# Patient Record
Sex: Female | Born: 1970
Health system: Southern US, Community
[De-identification: ages and names within clinical notes are randomized; demographics above are authoritative.]

## PROBLEM LIST (undated history)

## (undated) DIAGNOSIS — IMO0002 Reserved for concepts with insufficient information to code with codable children: Secondary | ICD-10-CM

## (undated) HISTORY — DX: Reserved for concepts with insufficient information to code with codable children: IMO0002

## (undated) HISTORY — PX: VARICOSE VEIN SURGERY: SHX832

## (undated) HISTORY — PX: TUBAL LIGATION: SHX77

---

## 1998-04-21 ENCOUNTER — Inpatient Hospital Stay (HOSPITAL_COMMUNITY): Admission: AD | Admit: 1998-04-21 | Discharge: 1998-04-23 | Payer: Self-pay | Admitting: Obstetrics and Gynecology

## 2009-07-17 ENCOUNTER — Ambulatory Visit: Payer: Self-pay | Admitting: Obstetrics and Gynecology

## 2009-08-07 ENCOUNTER — Other Ambulatory Visit: Payer: Self-pay | Admitting: Obstetrics & Gynecology

## 2009-08-07 ENCOUNTER — Ambulatory Visit: Payer: Self-pay | Admitting: Obstetrics & Gynecology

## 2009-08-07 ENCOUNTER — Other Ambulatory Visit: Admission: RE | Admit: 2009-08-07 | Discharge: 2009-08-07 | Payer: Self-pay | Admitting: Obstetrics and Gynecology

## 2009-08-21 ENCOUNTER — Ambulatory Visit: Payer: Self-pay | Admitting: Obstetrics & Gynecology

## 2010-07-01 ENCOUNTER — Emergency Department (HOSPITAL_COMMUNITY): Admission: EM | Admit: 2010-07-01 | Discharge: 2010-07-01 | Payer: Self-pay | Admitting: Emergency Medicine

## 2011-01-29 LAB — URINALYSIS, ROUTINE W REFLEX MICROSCOPIC
Glucose, UA: NEGATIVE mg/dL
Hgb urine dipstick: NEGATIVE
Ketones, ur: 15 mg/dL — AB
Nitrite: NEGATIVE
Specific Gravity, Urine: 1.029 (ref 1.005–1.030)
pH: 7 (ref 5.0–8.0)

## 2011-01-29 LAB — COMPREHENSIVE METABOLIC PANEL
Albumin: 4.4 g/dL (ref 3.5–5.2)
Calcium: 9.6 mg/dL (ref 8.4–10.5)
Chloride: 104 mEq/L (ref 96–112)
Creatinine, Ser: 0.6 mg/dL (ref 0.4–1.2)
GFR calc Af Amer: >60 mL/min (ref >60)
Sodium: 140 mEq/L (ref 135–145)
Total Bilirubin: 0.7 mg/dL (ref 0.3–1.2)

## 2011-01-29 LAB — CBC
HCT: 45.4 % (ref 36.0–46.0)
Hemoglobin: 16 g/dL — ABNORMAL HIGH (ref 12.0–15.0)
MCH: 31.4 pg (ref 26.0–34.0)
Platelets: 165 10*3/uL (ref 150–400)
RBC: 5.09 MIL/uL (ref 3.87–5.11)
WBC: 10.6 10*3/uL — ABNORMAL HIGH (ref 4.0–10.5)

## 2011-01-29 LAB — URINE CULTURE
Colony Count: NO GROWTH
Culture  Setup Time: 201108171828
Culture: NO GROWTH

## 2011-01-29 LAB — URINE MICROSCOPIC-ADD ON

## 2011-01-29 LAB — DIFFERENTIAL
Basophils Absolute: 0 10*3/uL (ref 0.0–0.1)
Eosinophils Absolute: 0 10*3/uL (ref 0.0–0.7)
Monocytes Absolute: 0.5 10*3/uL (ref 0.1–1.0)
Monocytes Relative: 5 % (ref 3–12)

## 2011-03-30 NOTE — Group Therapy Note (Signed)
Diane Holmes, Diane Holmes NO.:  [id number]   MEDICAL RECORD NO.:  [id number]           PATIENT TYPE:   LOCATION:  [facility]                     FACILITY:   PHYSICIAN:  Diane Antigua, MD     DATE OF BIRTH:  [birth year]   DATE OF SERVICE:                                  CLINIC NOTE   This is a 40 year old gravida 5, para 3-0-2-3 with last menstrual period  of June 29, 2009 who presented to clinic for evaluation for LEEP  procedure.  The patient reports no gyn care over the past 5 years until  this past June.  The patient was told she had a low-grade SIL followed  by a colposcopy evaluation, which demonstrated areas of high-grade SIL,  moderate dysplasia, and areas where CIS cannot be completely ruled out.  The patient understands the nature of these results, and is ready for  the LEEP procedure.   The patient denies any past medical history or past surgical history.   ALLERGIES TO MEDICATION:  PENICILLIN.   PHYSICAL EXAMINATION:  VITAL SIGNS:  Blood pressure 110/67, pulse of 56,  weight of 122.8 pounds, and height of 5 feet 5 inches.  ABDOMEN:  Soft, nontender.  GU:  The cervix and pelvic exam revealed a normal vaginal mucosa and  normal-appearing cervix, nonfriable.  No bleeding.   ASSESSMENT AND PLAN:  This is a 40 year old para 3-0-2-3 with  colposcopic finding significant for high-grade SIL with areas where CIS  cannot be ruled out.  The patient to be scheduled for a LEEP procedure  at her next visit.  Ashby Dawes of the procedure was described.  All  questions answered.  The patient to view the educational video prior to  be scheduled next week.           ______________________________  Diane Antigua, MD     PC/MEDQ  D:  07/17/2009  T:  07/17/2009  Job:  [id number]

## 2013-01-31 ENCOUNTER — Ambulatory Visit (INDEPENDENT_AMBULATORY_CARE_PROVIDER_SITE_OTHER): Payer: Managed Care, Other (non HMO) | Admitting: Family Medicine

## 2013-01-31 VITALS — BP 108/78 | HR 78 | Temp 99.1°F | Resp 16 | Ht 65.8 in | Wt 117.6 lb

## 2013-01-31 DIAGNOSIS — Z720 Tobacco use: Secondary | ICD-10-CM

## 2013-01-31 DIAGNOSIS — J029 Acute pharyngitis, unspecified: Secondary | ICD-10-CM

## 2013-01-31 DIAGNOSIS — J020 Streptococcal pharyngitis: Secondary | ICD-10-CM

## 2013-01-31 LAB — POCT RAPID STREP A (OFFICE): Rapid Strep A Screen: NEGATIVE

## 2013-01-31 NOTE — Patient Instructions (Addendum)

## 2013-01-31 NOTE — Progress Notes (Signed)
42 year old lady who is here with a history of having had a sore throat for the last 3 days. It started with a little bit of a raspy voice. The throat is continued to be sore. She does not have any rhinorrhea. She has had a minimal cough. He does smoke. She was breast-feeding until about 6 months ago but no longer is.  Objective: Pleasant lady in no major acute distress. Her TMs are normal. Throat mild erythema without exudate. Strep screen was taken. Poor dentition with inferior teeth in very bad shape. Neck supple without significant nodes. Chest is clear to auscultation. Heart regular without murmurs.  Assessment: Pharyngitis  Plan: Strep screen is pending  Results for orders placed in visit on 01/31/13  POCT RAPID STREP A (OFFICE)      Result Value Range   Rapid Strep A Screen Negative  Negative   Treat symptomatically.  STOP SMOKING!

## 2020-01-16 ENCOUNTER — Emergency Department (HOSPITAL_BASED_OUTPATIENT_CLINIC_OR_DEPARTMENT_OTHER): Payer: 59 | Attending: Physician Assistant

## 2020-01-16 ENCOUNTER — Emergency Department (HOSPITAL_BASED_OUTPATIENT_CLINIC_OR_DEPARTMENT_OTHER)
Admission: EM | Admit: 2020-01-16 | Discharge: 2020-01-16 | Disposition: A | Discharge status: Home / Self Care | Attending: Emergency Medicine | Admitting: Emergency Medicine

## 2020-01-16 ENCOUNTER — Other Ambulatory Visit: Payer: Self-pay

## 2020-01-16 ENCOUNTER — Emergency Department (HOSPITAL_BASED_OUTPATIENT_CLINIC_OR_DEPARTMENT_OTHER): Payer: 59

## 2020-01-16 ENCOUNTER — Encounter (HOSPITAL_BASED_OUTPATIENT_CLINIC_OR_DEPARTMENT_OTHER): Payer: Self-pay | Admitting: *Deleted

## 2020-01-16 DIAGNOSIS — F17200 Nicotine dependence, unspecified, uncomplicated: Secondary | ICD-10-CM | POA: Insufficient documentation

## 2020-01-16 DIAGNOSIS — X501XXA Overexertion from prolonged static or awkward postures, initial encounter: Secondary | ICD-10-CM | POA: Insufficient documentation

## 2020-01-16 DIAGNOSIS — Y929 Unspecified place or not applicable: Secondary | ICD-10-CM | POA: Insufficient documentation

## 2020-01-16 DIAGNOSIS — Y939 Activity, unspecified: Secondary | ICD-10-CM | POA: Diagnosis not present

## 2020-01-16 DIAGNOSIS — Y999 Unspecified external cause status: Secondary | ICD-10-CM | POA: Insufficient documentation

## 2020-01-16 DIAGNOSIS — M25561 Pain in right knee: Secondary | ICD-10-CM | POA: Insufficient documentation

## 2020-01-16 LAB — PREGNANCY, URINE: Preg Test, Ur: NEGATIVE

## 2020-01-16 MED ORDER — CYCLOBENZAPRINE HCL 10 MG PO TABS
10.0000 mg | ORAL_TABLET | Freq: Once | ORAL | Status: AC
  Administered 2020-01-16: 10 mg via ORAL
  Filled 2020-01-16: qty 1

## 2020-01-16 MED ORDER — IBUPROFEN 600 MG PO TABS: 600.0000 mg | ORAL_TABLET | Freq: Four times a day (QID) | ORAL | 0 refills | Status: DC | PRN

## 2020-01-16 MED ORDER — HYDROCODONE-ACETAMINOPHEN 5-325 MG PO TABS
1.0000 | ORAL_TABLET | Freq: Once | ORAL | Status: AC
  Administered 2020-01-16: 1 via ORAL
  Filled 2020-01-16: qty 1

## 2020-01-16 MED ORDER — HYDROCODONE-ACETAMINOPHEN 5-325 MG PO TABS: 1.0000 | ORAL_TABLET | Freq: Four times a day (QID) | ORAL | 0 refills | Status: DC | PRN

## 2020-01-16 MED ORDER — CYCLOBENZAPRINE HCL 10 MG PO TABS: 10.0000 mg | ORAL_TABLET | Freq: Two times a day (BID) | ORAL | 0 refills | Status: DC | PRN

## 2020-01-16 MED FILL — IBUPROFEN 600 MG TABLET: 600 | 8 days supply | Qty: 30 | Fill #0

## 2020-01-16 MED FILL — HYDROCODON-APAP 5-325: 5-325 | 3 days supply | Qty: 10 | Fill #0

## 2020-01-16 MED FILL — CYCLOBENZAPRINE HCL 10 MG T: 10 | 5 days supply | Qty: 10 | Fill #0

## 2020-01-16 NOTE — ED Triage Notes (Signed)
Patient was at work and squatted to pick up something and unable to get up.  Complaint of right knee pain

## 2020-01-16 NOTE — ED Notes (Signed)
UDS Completed

## 2020-01-16 NOTE — ED Provider Notes (Signed)
[facility]PART[facility] Provider Note   CSN: [id number] Arrival date & time: 01/16/20  1225     History Chief Complaint  Patient presents with  . Knee Pain    Diane Holmes is a 49 y.o. female presents for evaluation of acute onset, constant right lower extremity pain that began just prior to arrival.  She reports that she was at work where she had been squatting for approximately 20 minutes.  She states that when she began to stand she was unable to do so and developed severe pains to the right lower extremity from the hip radiating down to the thigh and knee.  She could not hear a "pop" but she states that work is very loud and that she wears ear protection.  Reports pain is worse along the medial aspect of the right knee.  She feels a little numbness and tingling around this area as well.  She denies any known injury otherwise.  No medicines prior to arrival.  No fevers  The history is provided by the patient.       Past Medical History:  Diagnosis Date  . Ulcer     There are no problems to display for this patient.   Past Surgical History:  Procedure Laterality Date  . TUBAL LIGATION    . VARICOSE VEIN SURGERY       OB History    Gravida  4   Para  4   Term      Preterm      AB      Living        SAB      TAB      Ectopic      Multiple      Live Births              Family History  Problem Relation Age of Onset  . Breast cancer Maternal Grandmother   . Stroke Maternal Grandmother   . Diabetes Paternal Uncle     Social History   Tobacco Use  . Smoking status: Current Every Day Smoker  . Smokeless tobacco: Never Used  Substance Use Topics  . Alcohol use: Yes    Comment: occasionally  . Drug use: No    Home Medications Prior to Admission medications   Medication Sig Start Date End Date Taking? Authorizing Provider  cyclobenzaprine (FLEXERIL) 10 MG tablet Take 1 tablet (10 mg total) by mouth 2 (two) times daily  as needed for muscle spasms. 01/16/20   Sharra Cayabyab A, PA-C  HYDROcodone-acetaminophen (NORCO/VICODIN) 5-325 MG tablet Take 1 tablet by mouth every 6 (six) hours as needed for severe pain. 01/16/20   Miyanna Wiersma A, PA-C  ibuprofen (ADVIL) 600 MG tablet Take 1 tablet (600 mg total) by mouth every 6 (six) hours as needed. 01/16/20   Nils Flack, Sanford Lindblad A, PA-C    Allergies    Penicillins  Review of Systems   Review of Systems  Constitutional: Negative for fever.  Musculoskeletal: Positive for arthralgias and myalgias.  Neurological: Positive for weakness and numbness.  All other systems reviewed and are negative.   Physical Exam Updated Vital Signs BP 110/68 (BP Location: Right Arm)   Pulse 78   Temp 98.3 F (36.8 C) (Oral)   Resp 20   Ht 5\' 5"  (1.651 m)   Wt 50.8 kg   LMP 01/24/2013   SpO2 99%   BMI 18.64 kg/m   Physical Exam Vitals and nursing note reviewed.  Constitutional:  General: She is in acute distress.     Appearance: She is well-developed.     Comments: Appears very anxious and uncomfortable sitting in wheelchair  HENT:     Head: Normocephalic and atraumatic.  Eyes:     General:        Right eye: No discharge.        Left eye: No discharge.     Conjunctiva/sclera: Conjunctivae normal.  Neck:     Vascular: No JVD.     Trachea: No tracheal deviation.  Cardiovascular:     Rate and Rhythm: Normal rate.  Pulmonary:     Effort: Pulmonary effort is normal.  Abdominal:     General: There is no distension.  Musculoskeletal:        General: Tenderness present.     Comments: Physical examination significantly limited due to pain.  No midline lumbar spine tenderness.  Mild tenderness to palpation along the posterior aspect of the right hip.  No crepitus.  Tenderness to palpation primarily along the quadriceps tendon and left MCL and medial meniscus.  Limited active range of motion of the right hip and knee.  Possible valgus instability of the right knee.  No quadriceps tendon  deformity.  Able to extend the extremity against gravity.  Skin:    General: Skin is warm and dry.     Findings: No erythema.  Neurological:     Mental Status: She is alert.     Comments: Sensation intact to light touch of bilateral lower extremities.  5/5 strength of left lower extremity major muscle groups and with plantarflexion and dorsiflexion of the left foot.  Psychiatric:        Behavior: Behavior normal.     ED Results / Procedures / Treatments   Labs (all labs ordered are listed, but only abnormal results are displayed) Labs Reviewed  PREGNANCY, URINE    EKG None  Radiology DG Knee Complete 4 Views Right  Result Date: 01/16/2020 CLINICAL DATA:  Pain EXAM: RIGHT KNEE - COMPLETE 4+ VIEW COMPARISON:  None. FINDINGS: There is no acute displaced fracture or dislocation. There is a small suprapatellar joint effusion. The joint spaces are relatively well preserved. IMPRESSION: 1. No acute displaced fracture or dislocation. 2. Small suprapatellar joint effusion. Electronically Signed   By: Katherine Mantle M.D.   On: 01/16/2020 15:13   DG Hip Unilat With Pelvis 2-3 Views Right  Result Date: 01/16/2020 CLINICAL DATA:  Injury EXAM: DG HIP (WITH OR WITHOUT PELVIS) 2-3V RIGHT COMPARISON:  None. FINDINGS: There is no evidence of hip fracture or dislocation. There is no evidence of arthropathy or other focal bone abnormality. IMPRESSION: Negative. Electronically Signed   By: Katherine Mantle M.D.   On: 01/16/2020 15:12    Procedures Procedures (including critical care time)  Medications Ordered in ED Medications  HYDROcodone-acetaminophen (NORCO/VICODIN) 5-325 MG per tablet 1 tablet (1 tablet Oral Given 01/16/20 1314)  cyclobenzaprine (FLEXERIL) tablet 10 mg (10 mg Oral Given 01/16/20 1314)    ED Course  I have reviewed the triage vital signs and the nursing notes.  Pertinent labs & imaging results that were available during my care of the patient were reviewed by me and  considered in my medical decision making (see chart for details).    MDM Rules/Calculators/A&P                       Patient presenting for evaluation of right lower extremity pain secondary to injury at work  where she was squatting and attempted to stand up.  She is afebrile, initially a little hypertensive but vital signs otherwise stable.  She did appear to be quite uncomfortable and her physical examination was initially limited due to her pain.  Her pain was controlled in the ED.  She is neurovascularly intact and compartments are soft.  Radiographs show a small suprapatellar joint effusion to the right knee but otherwise no acute osseous abnormality.  On reevaluation the patient is resting more comfortably, reports that she is feeling better.  She is able to extend flex and extend the knee against resistance.  Pelvis appears stable.  No midline spine tenderness.  Doubt DVT, septic arthritis, osteomyelitis.  She does have some valgus instability on examination of the left knee and has maximal tenderness to palpation along the MCL.  Suspect possible ligament injury.  Will place in knee immobilizer and give crutches.  Discussed conservative therapy and management with NSAIDs, Tylenol.  Discussed utility of hydrocodone and muscle relaxers and discussed potential side effects and appropriate use of these medications.  Recommend follow-up with orthopedics on an outpatient basis.  Discussed strict ED return precautions. Patient verbalized understanding of and agreement with plan and is safe for discharge home at this time.  Final Clinical Impression(s) / ED Diagnoses Final diagnoses:  Acute pain of right knee    Rx / DC Orders ED Discharge Orders         Ordered    HYDROcodone-acetaminophen (NORCO/VICODIN) 5-325 MG tablet  Every 6 hours PRN     01/16/20 1559    ibuprofen (ADVIL) 600 MG tablet  Every 6 hours PRN     01/16/20 1559    cyclobenzaprine (FLEXERIL) 10 MG tablet  2 times daily PRN      01/16/20 1559           Jeanie Sewer, PA-C 01/16/20 1721    Gwyneth Sprout, MD 01/19/20 0710

## 2020-01-16 NOTE — Discharge Instructions (Addendum)
I suspect he may have injured a ligament in your knee.  Your x-rays are reassuring, just some fluid around the right knee.  1. Medications: Alternate 600 mg of ibuprofen and [phone] mg of Tylenol every 3 hours as needed for pain. Do not exceed 4000 mg of Tylenol daily.  Take ibuprofen with food to avoid upset stomach issues.  You can take hydrocodone as needed for severe breakthrough pain but do not drive, drink alcohol, or operate heavy machinery while taking this medicine as it can cause drowsiness.  Be aware this medicine also contains Tylenol.  The Flexeril which is a muscle relaxer can also cause drowsiness so the same precautions apply. 2. Treatment: rest, use the knee immobilizer and crutches to help you get around, avoid bearing weight for the first couple of days.  When you are not walking, keep the extremity elevated and apply ice 20 minutes at a time. 3. Follow Up: Please followup with orthopedics as directed or your PCP in 1 week if no improvement for discussion of your diagnoses and further evaluation after today's visit; Please return to the ER for worsening symptoms or other concerns such as worsening swelling, redness of the skin, fevers, loss of pulses, or loss of feeling

## 2020-08-22 IMAGING — DX DG KNEE COMPLETE 4+V*R*
4 series · 4 of 4 positions shown · non-contrast
Comparison: None.

CLINICAL DATA: Pain

EXAM:
RIGHT KNEE - COMPLETE 4+ VIEW

[knee ap]
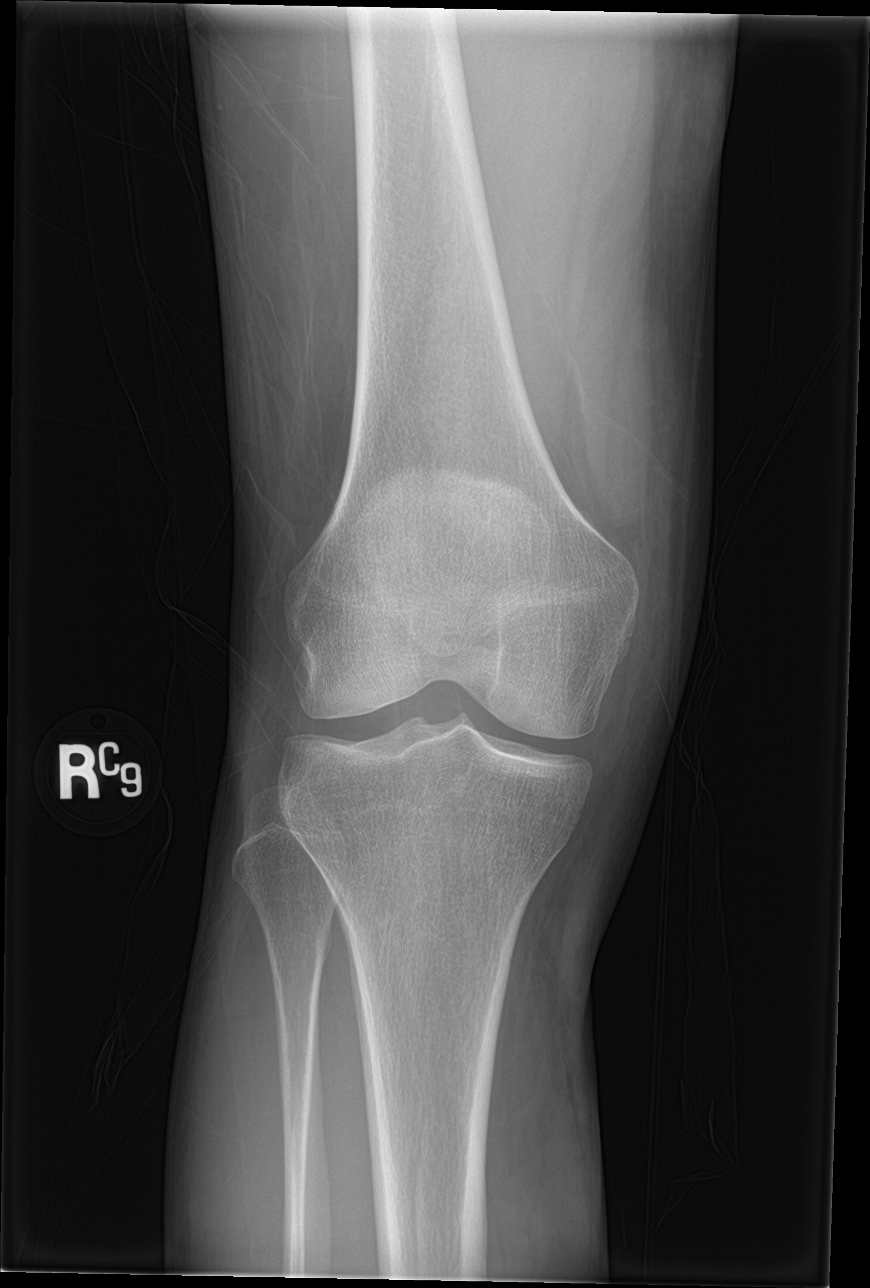

[knee obl (1 of 2)]
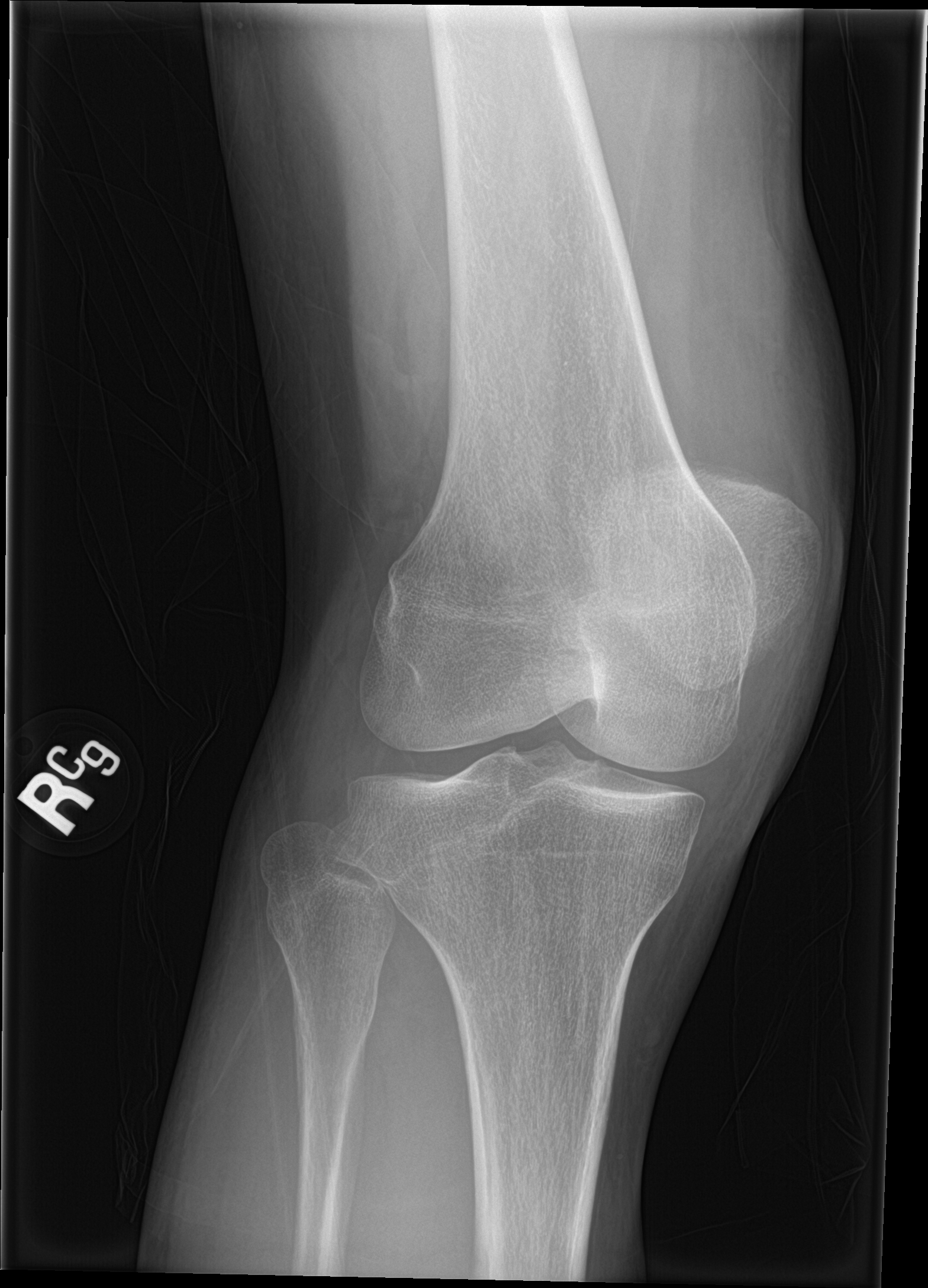

[knee obl (2 of 2)]
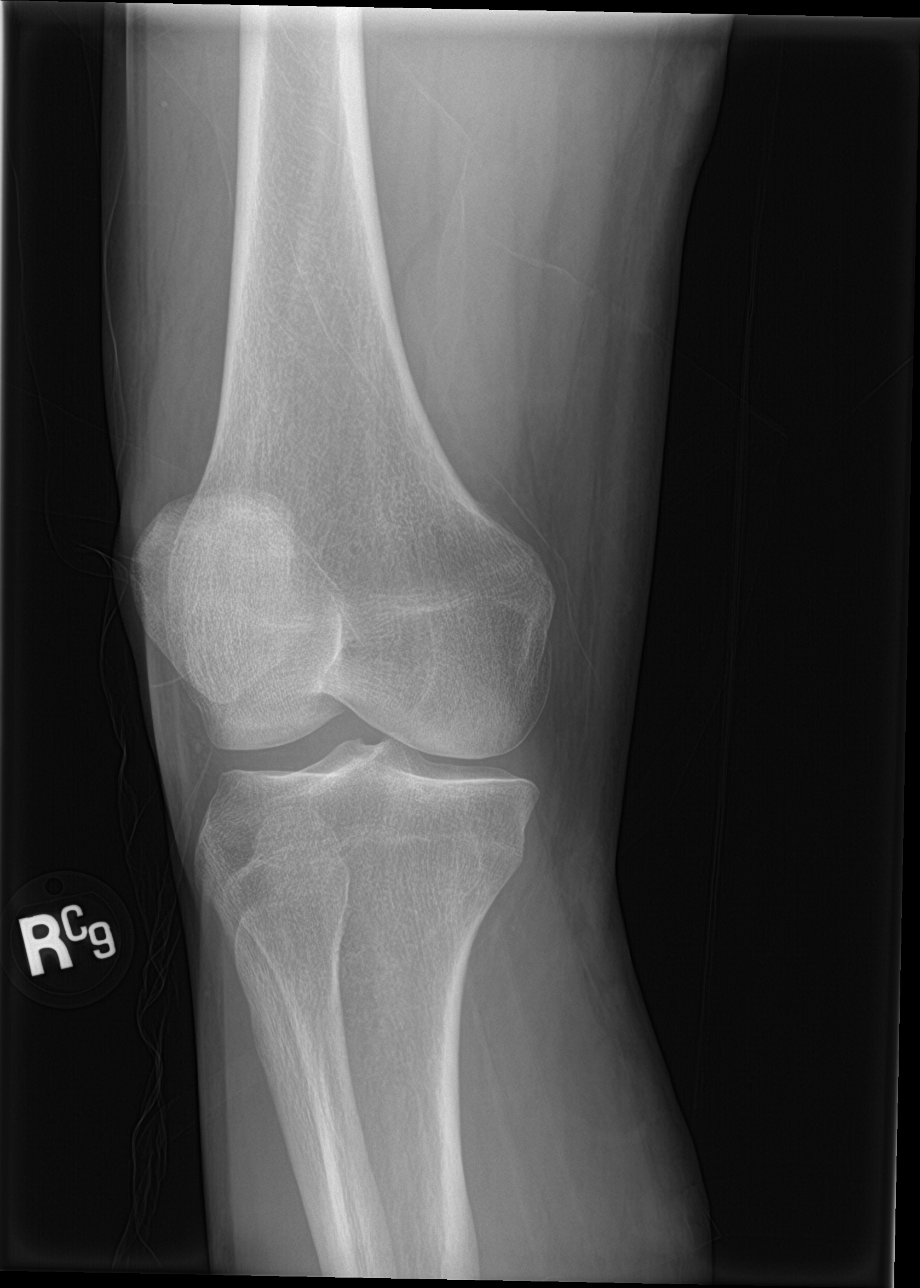

[knee lat]
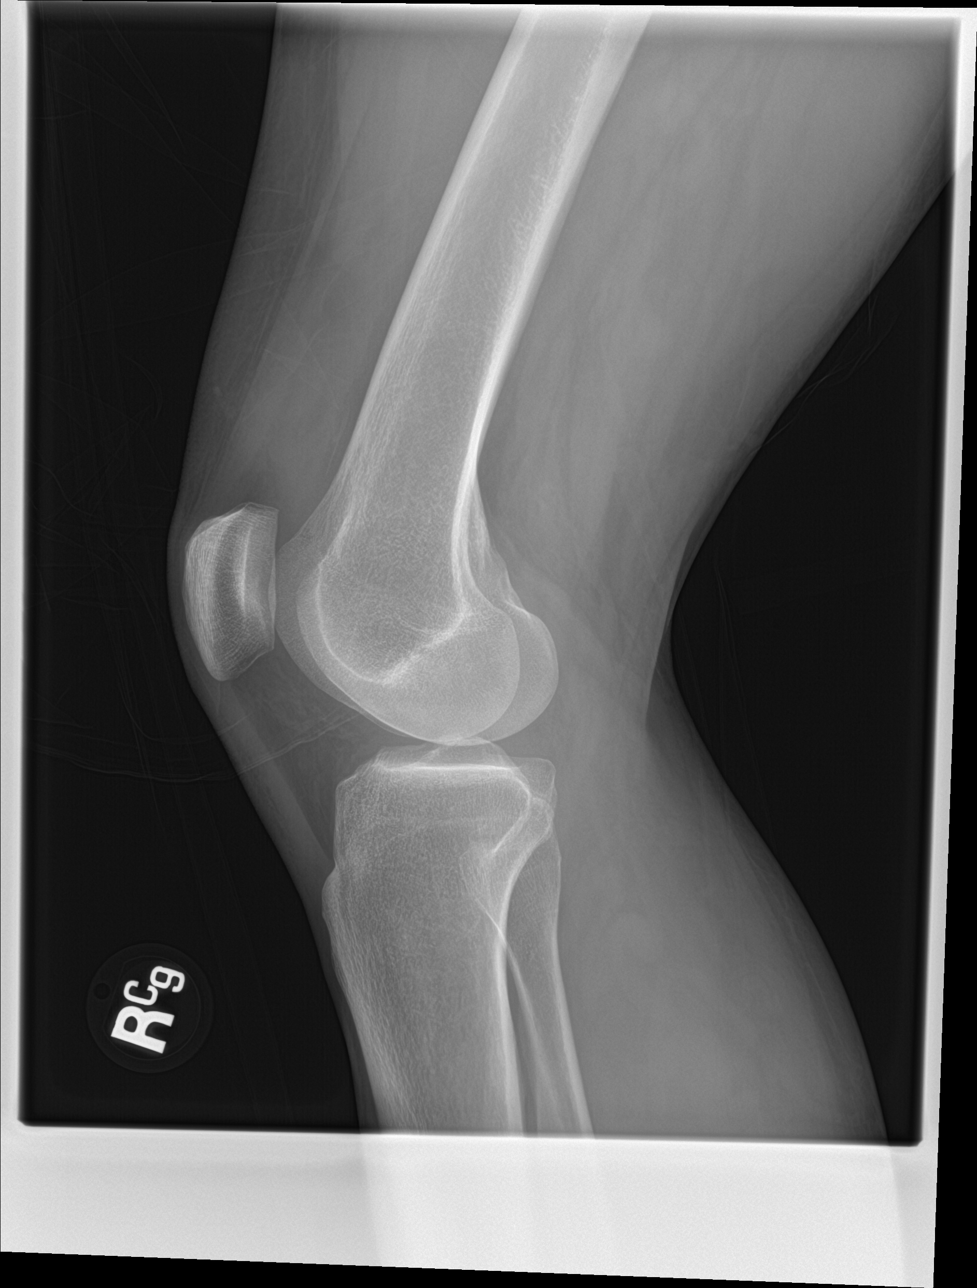

[4 of 4 positions shown; findings below may reference images not displayed]

FINDINGS: There is no acute displaced fracture or dislocation. There is a
small suprapatellar joint effusion. The joint spaces are relatively
well preserved.
IMPRESSION: 1. No acute displaced fracture or dislocation.
2. Small suprapatellar joint effusion.

## 2022-12-08 DIAGNOSIS — R0981 Nasal congestion: Secondary | ICD-10-CM | POA: Diagnosis not present

## 2022-12-08 DIAGNOSIS — J029 Acute pharyngitis, unspecified: Secondary | ICD-10-CM | POA: Diagnosis not present

## 2022-12-08 DIAGNOSIS — R197 Diarrhea, unspecified: Secondary | ICD-10-CM | POA: Diagnosis not present

## 2022-12-08 DIAGNOSIS — R509 Fever, unspecified: Secondary | ICD-10-CM | POA: Diagnosis not present

## 2022-12-08 DIAGNOSIS — J069 Acute upper respiratory infection, unspecified: Secondary | ICD-10-CM | POA: Diagnosis not present

## 2023-05-12 DIAGNOSIS — J029 Acute pharyngitis, unspecified: Secondary | ICD-10-CM | POA: Diagnosis not present

## 2023-05-12 DIAGNOSIS — R52 Pain, unspecified: Secondary | ICD-10-CM | POA: Diagnosis not present

## 2023-05-12 DIAGNOSIS — U071 COVID-19: Secondary | ICD-10-CM | POA: Diagnosis not present

## 2023-05-12 DIAGNOSIS — R059 Cough, unspecified: Secondary | ICD-10-CM | POA: Diagnosis not present

## 2023-12-18 DIAGNOSIS — J069 Acute upper respiratory infection, unspecified: Secondary | ICD-10-CM | POA: Diagnosis not present

## 2024-02-27 ENCOUNTER — Inpatient Hospital Stay (HOSPITAL_COMMUNITY)
Admission: EM | Admit: 2024-02-27 | Discharge: 2024-02-29 | DRG: 814 | Disposition: A | Discharge status: Home / Self Care | Attending: Internal Medicine | Admitting: Internal Medicine

## 2024-02-27 ENCOUNTER — Other Ambulatory Visit: Payer: Self-pay

## 2024-02-27 ENCOUNTER — Emergency Department (HOSPITAL_COMMUNITY)

## 2024-02-27 ENCOUNTER — Encounter (HOSPITAL_COMMUNITY): Payer: Self-pay | Admitting: Internal Medicine

## 2024-02-27 DIAGNOSIS — M549 Dorsalgia, unspecified: Secondary | ICD-10-CM | POA: Diagnosis not present

## 2024-02-27 DIAGNOSIS — Z833 Family history of diabetes mellitus: Secondary | ICD-10-CM | POA: Diagnosis not present

## 2024-02-27 DIAGNOSIS — F17210 Nicotine dependence, cigarettes, uncomplicated: Secondary | ICD-10-CM | POA: Diagnosis present

## 2024-02-27 DIAGNOSIS — I700 Atherosclerosis of aorta: Secondary | ICD-10-CM | POA: Diagnosis not present

## 2024-02-27 DIAGNOSIS — I7779 Dissection of other artery: Secondary | ICD-10-CM | POA: Diagnosis not present

## 2024-02-27 DIAGNOSIS — Z880 Allergy status to penicillin: Secondary | ICD-10-CM

## 2024-02-27 DIAGNOSIS — R519 Headache, unspecified: Secondary | ICD-10-CM | POA: Insufficient documentation

## 2024-02-27 DIAGNOSIS — E785 Hyperlipidemia, unspecified: Secondary | ICD-10-CM | POA: Diagnosis present

## 2024-02-27 DIAGNOSIS — Z716 Tobacco abuse counseling: Secondary | ICD-10-CM

## 2024-02-27 DIAGNOSIS — K921 Melena: Secondary | ICD-10-CM | POA: Diagnosis not present

## 2024-02-27 DIAGNOSIS — F17200 Nicotine dependence, unspecified, uncomplicated: Secondary | ICD-10-CM | POA: Diagnosis not present

## 2024-02-27 DIAGNOSIS — I2510 Atherosclerotic heart disease of native coronary artery without angina pectoris: Secondary | ICD-10-CM | POA: Diagnosis not present

## 2024-02-27 DIAGNOSIS — D735 Infarction of spleen: Secondary | ICD-10-CM | POA: Diagnosis not present

## 2024-02-27 DIAGNOSIS — F419 Anxiety disorder, unspecified: Secondary | ICD-10-CM | POA: Diagnosis not present

## 2024-02-27 DIAGNOSIS — R109 Unspecified abdominal pain: Secondary | ICD-10-CM | POA: Diagnosis not present

## 2024-02-27 DIAGNOSIS — Z823 Family history of stroke: Secondary | ICD-10-CM

## 2024-02-27 DIAGNOSIS — K551 Chronic vascular disorders of intestine: Secondary | ICD-10-CM | POA: Diagnosis present

## 2024-02-27 DIAGNOSIS — R0789 Other chest pain: Secondary | ICD-10-CM | POA: Diagnosis not present

## 2024-02-27 DIAGNOSIS — I8390 Asymptomatic varicose veins of unspecified lower extremity: Secondary | ICD-10-CM | POA: Diagnosis present

## 2024-02-27 DIAGNOSIS — Z8711 Personal history of peptic ulcer disease: Secondary | ICD-10-CM | POA: Diagnosis not present

## 2024-02-27 DIAGNOSIS — K55069 Acute infarction of intestine, part and extent unspecified: Secondary | ICD-10-CM | POA: Diagnosis not present

## 2024-02-27 DIAGNOSIS — Z803 Family history of malignant neoplasm of breast: Secondary | ICD-10-CM

## 2024-02-27 DIAGNOSIS — Z8249 Family history of ischemic heart disease and other diseases of the circulatory system: Secondary | ICD-10-CM

## 2024-02-27 DIAGNOSIS — K259 Gastric ulcer, unspecified as acute or chronic, without hemorrhage or perforation: Secondary | ICD-10-CM | POA: Diagnosis not present

## 2024-02-27 HISTORY — DX: Infarction of spleen: D73.5

## 2024-02-27 LAB — I-STAT CHEM 8, ED
BUN: 10 mg/dL (ref 6–20)
Calcium, Ion: 1.1 mmol/L — ABNORMAL LOW (ref 1.15–1.40)
Chloride: 100 mmol/L (ref 98–111)
Creatinine, Ser: 0.6 mg/dL (ref 0.44–1.00)
Glucose, Bld: 96 mg/dL (ref 70–99)
HCT: 48 % — ABNORMAL HIGH (ref 36.0–46.0)
Hemoglobin: 16.3 g/dL — ABNORMAL HIGH (ref 12.0–15.0)
Potassium: 3.7 mmol/L (ref 3.5–5.1)
Sodium: 136 mmol/L (ref 135–145)
TCO2: 27 mmol/L (ref 22–32)

## 2024-02-27 LAB — BASIC METABOLIC PANEL WITH GFR
Anion gap: 11 (ref 5–15)
BUN: 9 mg/dL (ref 6–20)
CO2: 25 mmol/L (ref 22–32)
Calcium: 9.3 mg/dL (ref 8.9–10.3)
Chloride: 101 mmol/L (ref 98–111)
Creatinine, Ser: 0.67 mg/dL (ref 0.44–1.00)
GFR, Estimated: >60 mL/min (ref >60)
Glucose, Bld: 96 mg/dL (ref 70–99)
Potassium: 3.9 mmol/L (ref 3.5–5.1)
Sodium: 137 mmol/L (ref 135–145)

## 2024-02-27 LAB — URINALYSIS, ROUTINE W REFLEX MICROSCOPIC
Bilirubin Urine: NEGATIVE
Glucose, UA: NEGATIVE mg/dL
Hgb urine dipstick: NEGATIVE
Ketones, ur: NEGATIVE mg/dL
Leukocytes,Ua: NEGATIVE
Nitrite: NEGATIVE
Protein, ur: NEGATIVE mg/dL
Specific Gravity, Urine: 1.009 (ref 1.005–1.030)
pH: 7 (ref 5.0–8.0)

## 2024-02-27 LAB — CBC
HCT: 47.1 % — ABNORMAL HIGH (ref 36.0–46.0)
Hemoglobin: 16.1 g/dL — ABNORMAL HIGH (ref 12.0–15.0)
MCH: 30.8 pg (ref 26.0–34.0)
MCHC: 34.2 g/dL (ref 30.0–36.0)
MCV: 90.1 fL (ref 80.0–100.0)
Platelets: 166 10*3/uL (ref 150–400)
RBC: 5.23 MIL/uL — ABNORMAL HIGH (ref 3.87–5.11)
RDW: 11.8 % (ref 11.5–15.5)
WBC: 8.6 10*3/uL (ref 4.0–10.5)
nRBC: 0 % (ref 0.0–0.2)

## 2024-02-27 LAB — TROPONIN I (HIGH SENSITIVITY)
Troponin I (High Sensitivity): 4 ng/L (ref <18)
Troponin I (High Sensitivity): 5 ng/L (ref <18)

## 2024-02-27 LAB — LIPASE, BLOOD: Lipase: 38 U/L (ref 11–51)

## 2024-02-27 LAB — HIV ANTIBODY (ROUTINE TESTING W REFLEX): HIV Screen 4th Generation wRfx: NONREACTIVE

## 2024-02-27 MED ORDER — HEPARIN (PORCINE) 25000 UT/250ML-% IV SOLN
1050.0000 [IU]/h | INTRAVENOUS | Status: DC
  Administered 2024-02-27: 900 [IU]/h via INTRAVENOUS
  Administered 2024-02-28: 950 [IU]/h via INTRAVENOUS
  Filled 2024-02-27 (×2): qty 250

## 2024-02-27 MED ORDER — KETOROLAC TROMETHAMINE 15 MG/ML IJ SOLN
15.0000 mg | Freq: Once | INTRAMUSCULAR | Status: AC
  Administered 2024-02-27: 15 mg via INTRAVENOUS
  Filled 2024-02-27: qty 1

## 2024-02-27 MED ORDER — HYDROMORPHONE HCL 1 MG/ML IJ SOLN
0.5000 mg | Freq: Once | INTRAMUSCULAR | Status: AC
  Administered 2024-02-27: 0.5 mg via INTRAVENOUS
  Filled 2024-02-27: qty 1

## 2024-02-27 MED ORDER — HEPARIN BOLUS VIA INFUSION
3500.0000 [IU] | Freq: Once | INTRAVENOUS | Status: AC
  Administered 2024-02-27: 3500 [IU] via INTRAVENOUS
  Filled 2024-02-27: qty 3500

## 2024-02-27 MED ORDER — OXYCODONE HCL 5 MG PO TABS
5.0000 mg | ORAL_TABLET | ORAL | Status: DC | PRN
  Administered 2024-02-28 – 2024-02-29 (×4): 5 mg via ORAL
  Filled 2024-02-27 (×4): qty 1

## 2024-02-27 MED ORDER — ACETAMINOPHEN 500 MG PO TABS
1000.0000 mg | ORAL_TABLET | Freq: Three times a day (TID) | ORAL | Status: DC
  Administered 2024-02-27 – 2024-02-29 (×3): 1000 mg via ORAL
  Filled 2024-02-27 (×5): qty 2

## 2024-02-27 MED ORDER — SODIUM CHLORIDE 0.9 % IV BOLUS
1000.0000 mL | Freq: Once | INTRAVENOUS | Status: AC
  Administered 2024-02-27: 1000 mL via INTRAVENOUS

## 2024-02-27 MED ORDER — HYDROMORPHONE HCL 1 MG/ML IJ SOLN: 0.5000 mg | INTRAMUSCULAR | Status: DC | PRN

## 2024-02-27 MED ORDER — IOHEXOL 350 MG/ML SOLN
100.0000 mL | Freq: Once | INTRAVENOUS | Status: AC | PRN
  Administered 2024-02-27: 100 mL via INTRAVENOUS

## 2024-02-27 NOTE — H&P (Cosign Needed Addendum)
 Date: 02/27/2024               Patient Name:  Diane Holmes MRN: [id number]  DOB: 06/26/1971 Age / Sex: 53 y.o., female   PCP: Patient, No Pcp Per              Medical Service: Internal Medicine Teaching Service              Attending Physician: Dr. Broadus Canes, Whitney Hams, MD    First Contact: Enos Harts, MS4 Pager: [phone]  Second Contact: Dr. Hayes Lipps Pager: [phone]            After Hours (After 5p/  First Contact Pager: [phone]  weekends / holidays): Second Contact Pager: [phone]   Chief Complaint: "Terrible pain in my stomach"  Patient Summary Diane Holmes is a 53 y.o. female with a 38 pack year smoking history and no significant past medical history who presents to the ED with abdominal pain found to have splenic artery dissection.  History of Present Illness Yesterday, Diane Holmes noticed that her stomach felt full. She says that the sensation felt like she may need too use the bathroom. Today, when she woke up around 3 AM, she felt abdominal pain that began centrally in her lower abdomen. She went to work as normal at 5 AM, trying to work through the pain. The pain spread from the lower abdomen to the periumbilical region then the epigastrum, and radiating to the back. By 8 AM in the morning, she said that the pain was so severe that her boss told her she couldn't work in this condition. This has never happened before. She denies nausea, vomiting, changes in vision, dizziness, hemiplegia, tachycardia, chest pain, or abdominal trauma. Two weeks ago, she had an upper respiratory infection, and her daughter who lives with her tested positive for flu. She has been experiencing regular headaches for 3 months that do not require pharmacologic treatment, which she attribute to caffeine use. She endorses inconsistent bowel movements that are intermittently hard and dry, or black and sticky. Her boyfriend in the room suggests that her stools have been black and sticky  for 3 months. She denies any bright red blood in stools.  Past Medical History Gastric ulcer requiring hospitalization, treated with diet  Meds None  Allergies As a child broke out in hives with penicillin and has avoided them since.  Surgical history Tubal ligation  Family History Father had a heart attack Does not know of any Marfan's syndrome/Ehlers-Danlos syndrome or habitus in family  Social History Lives at home with daughter and boyfriend. 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 She has smoked 1 ppd since she was 14. She drinks 2 shots once per month. She does not use non prescription drugs or over the counter medications, besides occasional tylenol or alleve for headaches once every 2 weeks.  Review of Systems: A complete ROS was negative except as per HPI.  Vitals: Blood pressure 124/79, pulse (!) 58, temperature 98.4 F (36.9 C), temperature source Axillary, resp. rate 15, height 5\' 4"  (1.626 m), weight 53.5 kg, last menstrual period 01/24/2013, SpO2 97%.  Physical Exam: GEN: No acute distress, uncomfortable in bed because of abdominal pain, breathing without increased effort, normally proportioned body habitus, face mildly pale CV: RRR, no murmurs, PT 2+ bilaterally, feet warm, well perfused, hands warm, capillary refill <2s, no carotid bruits PULM: CTAB, able to take deep breaths without pain ABD: Epigastric tenderness directly below  the sternum, tender in right lower quadrant with involuntary guarding, no rebound tenderness, no bruises  Labs Lipase 38 Hgb 16.3 hsTropI 5  Imaging CTA chest/abdomen/pelvis - Mid splenic artery dissection - Changes consistent with splenic infarction - Complete SMA occlusion - No aortic dissection - Chronic changes: emphysema, aortic atherosclerosis, coronary calcifications  EKG Sinus rhythm, no ischemic changes  Assessment & Plan by Problem:  Assessment Diane Holmes is a 53 y.o. female with  a 38 pack year smoking history and no significant past medical history who presents to the ED with abdominal pain found to have splenic artery dissection, splenic infarction and SMA obstruction then admitted for anticoagulation and management of vascular injuries.  Principal Problem   Dissection of splenic artery (HCC)   Occlusion of superior mesenteric artery (HCC)   Splenic infarction CT demonstrates multiple vascular injuries and sequelae of these injuries. Treatment being driven by vascular surgery, will follow recs from Dr. Fulton Job and team. Patient feels better after 1L NS bolus, heparin gtt and hydromorphone. The cause of this constellation of findings is unknown. The differential is broad but in the absence of trauma includes connective tissue disease (Marfan's / Elhers-Danlos / homocystinuria), vasculitis, blood pressure changes or infection in the context of tobacco related arterial disease. Patient's blood pressure is within normal limits. - Heparin 900 U/hr - CBC every day - Pain control -- Ketorolac 15 mg IV one time -- Tylenol 1000 mg Q8H oral -- Oxycodone 5 mg Q4H PRN oral -- Hydromorphone 0.5 mg Q4H PRN IV - Vascular surgery currently not planning for surgical intervention  Active Problems   Tobacco use disorder Patient's smoking may be contributing to their susceptibility to their disease. Broached the topic of quitting at admission. Plan to explore available options for quitting further during hospital stay. - TOC consult for tobacco cessation    Black tarry stools Intermittent black stools may represent a new gastric ulcer. Plan to follow up with outpatient. Will need to find a PCP. - Outpatient follow up - Will need to find PCP  Diet: Regular VTE: Heparin Code: Full  Dispo: Admit patient to Inpatient with expected length of stay greater than 2 midnights.  Signed: Mccartney Chuba, Medical Student 02/27/2024, 5:55 PM

## 2024-02-27 NOTE — Progress Notes (Signed)
 ANTICOAGULATION CONSULT NOTE  Pharmacy Consult for Heparin Indication:  splenic artery dissection/occlusion  Allergies  Allergen Reactions   Penicillins Hives    Patient Measurements: Height: 5\' 4"  (162.6 cm) Weight: 53.5 kg (118 lb) IBW/kg (Calculated) : 54.7 Heparin Dosing Weight: 53.5 kg  Vital Signs: Temp: 98.2 F (36.8 C) (04/14 1303) Temp Source: Oral (04/14 1303) BP: 107/48 (04/14 1252) Pulse Rate: 67 (04/14 1252)  Labs: Recent Labs    02/27/24 0902 02/27/24 0920 02/27/24 1104  HGB 16.1* 16.3*  --   HCT 47.1* 48.0*  --   PLT 166  --   --   CREATININE 0.67 0.60  --   TROPONINIHS 4  --  5    Estimated Creatinine Clearance: 69.5 mL/min (by C-G formula based on SCr of 0.6 mg/dL).   Medical History: Past Medical History:  Diagnosis Date   Ulcer     Medications:  (Not in a hospital admission)  Scheduled:    HYDROmorphone (DILAUDID) injection  0.5 mg Intravenous Once   Infusions:  PRN:   Assessment: 52 yof presenting with abdominal pain. Heparin per pharmacy consult placed for  splenic artery dissection/occlusion .  Patient is not on anticoagulation prior to arrival.  Hgb 16.3; plt 166  Goal of Therapy:  Heparin level 0.3-0.7 units/ml Monitor platelets by anticoagulation protocol: Yes   Plan:  Give IV heparin 3500 units bolus x 1 Start heparin infusion at 900 units/hr Check anti-Xa level in 8 hours and daily while on heparin Continue to monitor H&H and platelets  Dionicio Fray, PharmD, BCPS 02/27/2024 3:30 PM ED Clinical Pharmacist -  [phone]

## 2024-02-27 NOTE — Hospital Course (Addendum)
 Vascular looked at the images, repeat in day or 2, clark will see, heparin   Woke up, stomach felt full. Constipated? Abdominal pain worsened as day went on. Went from suprapubic to perumbilical to epigastric to substernal radiating to back No n/v Headaches for 3 months, thinks 2/2 caffeine 2 weeks constipation No tachycardia, weakness, dizziness, abdominal trauma Darker stools recently. No brbpr  No pmh No pcp  No meds  Tubal ligation  Tobacco: 1 ppd for 40 years Alcohol: rare, once per month Drugs: none  Works at [employer] with daughter and boyfriend

## 2024-02-27 NOTE — ED Provider Notes (Signed)
 [place] EMERGENCY DEPARTMENT AT [facility] Provider Note  CSN: [id number] Arrival date & time: 02/27/24 4696  Chief Complaint(s) Abdominal Pain, Back Pain, and Chest Pain  HPI Diane Holmes is a 53 y.o. female without significant past medical history presenting to the emergency department with abdominal pain.  Patient reports abdominal pain throughout her abdomen, reports it radiates to her back and up to her chest.  Denies similar episode.  Woke up at 3 AM to get ready for work and the pain was already there.  She reports that the pain waxes and wanes.  No fevers or chills.  No nausea or vomiting.  Does not go to arms or legs.  No headache.  No numbness or tingling, weakness.  No diarrhea.  No urinary symptoms.  Denies similar episode.  Past Medical History Past Medical History:  Diagnosis Date   Ulcer    There are no active problems to display for this patient.  Home Medication(s) Prior to Admission medications   Not on File                                                                                                                                    Past Surgical History Past Surgical History:  Procedure Laterality Date   TUBAL LIGATION     VARICOSE VEIN SURGERY     Family History Family History  Problem Relation Age of Onset   Breast cancer Maternal Grandmother    Stroke Maternal Grandmother    Diabetes Paternal Uncle     Social History Social History   Tobacco Use   Smoking status: Every Day   Smokeless tobacco: Never  Substance Use Topics   Alcohol use: Yes    Comment: occasionally   Drug use: No   Allergies Penicillins  Review of Systems Review of Systems  All other systems reviewed and are negative.   Physical Exam Vital Signs  I have reviewed the triage vital signs BP 95/73   Pulse (!) 51   Temp 98.2 F (36.8 C) (Oral)   Resp 15   Ht 5\' 4"  (1.626 m)   Wt 53.5 kg   LMP 01/24/2013   SpO2 98%   BMI 20.25 kg/m  Physical  Exam Vitals and nursing note reviewed.  Constitutional:      General: She is not in acute distress.    Appearance: She is well-developed.     Comments: Appears uncomfortable  HENT:     Head: Normocephalic and atraumatic.     Mouth/Throat:     Mouth: Mucous membranes are moist.  Eyes:     Pupils: Pupils are equal, round, and reactive to light.  Cardiovascular:     Rate and Rhythm: Normal rate and regular rhythm.     Pulses:          Radial pulses are 2+ on the right side and 2+ on the left side.  Heart sounds: No murmur heard. Pulmonary:     Effort: Pulmonary effort is normal. No respiratory distress.     Breath sounds: Normal breath sounds.  Abdominal:     General: Abdomen is flat.     Palpations: Abdomen is soft.     Tenderness: There is generalized abdominal tenderness.  Musculoskeletal:        General: No tenderness.     Right lower leg: No edema.     Left lower leg: No edema.  Skin:    General: Skin is warm and dry.  Neurological:     General: No focal deficit present.     Mental Status: She is alert. Mental status is at baseline.  Psychiatric:        Mood and Affect: Mood normal.        Behavior: Behavior normal.     ED Results and Treatments Labs (all labs ordered are listed, but only abnormal results are displayed) Labs Reviewed  CBC - Abnormal; Notable for the following components:      Result Value   RBC 5.23 (*)    Hemoglobin 16.1 (*)    HCT 47.1 (*)    All other components within normal limits  I-STAT CHEM 8, ED - Abnormal; Notable for the following components:   Calcium, Ion 1.10 (*)    Hemoglobin 16.3 (*)    HCT 48.0 (*)    All other components within normal limits  BASIC METABOLIC PANEL WITH GFR  LIPASE, BLOOD  URINALYSIS, ROUTINE W REFLEX MICROSCOPIC  HEPARIN LEVEL (UNFRACTIONATED)  TROPONIN I (HIGH SENSITIVITY)  TROPONIN I (HIGH SENSITIVITY)                                                                                                                           Radiology CT Angio Chest/Abd/Pel for Dissection W and/or W/WO Result Date: 02/27/2024 CLINICAL DATA:  Acute onset chest, back, and stomach pain EXAM: CT ANGIOGRAPHY CHEST, ABDOMEN AND PELVIS TECHNIQUE: Non-contrast CT of the chest was initially obtained. Multidetector CT imaging through the chest, abdomen and pelvis was performed using the standard protocol during bolus administration of intravenous contrast. Multiplanar reconstructed images and MIPs were obtained and reviewed to evaluate the vascular anatomy. RADIATION DOSE REDUCTION: This exam was performed according to the departmental dose-optimization program which includes automated exposure control, adjustment of the mA and/or kV according to patient size and/or use of iterative reconstruction technique. CONTRAST:  OMNIPAQUE IOHEXOL 350 MG/ML SOLN COMPARISON:  Same day chest radiograph, CT abdomen and pelvis dated 07/01/2010 FINDINGS: CTA CHEST FINDINGS Cardiovascular: Preferential opacification of the thoracic aorta. Nongated examination with motion artifact involving the aortic root and ascending aorta. No evidence of thoracic aortic aneurysm or dissection. Normal heart size. No pericardial effusion. No central pulmonary emboli. Coronary artery calcifications. Mediastinum/Nodes: Imaged thyroid gland without nodules meeting criteria for imaging follow-up by size. Normal esophagus. No pathologically enlarged axillary, supraclavicular, mediastinal, or hilar lymph nodes. Lungs/Pleura: The central airways are  patent. Upper lung predominant moderate paraseptal and centrilobular emphysema. Innumerable punctate and more tubular calcified granulomata in the right middle lobe. No pneumothorax. No pleural effusion. Musculoskeletal: No acute or abnormal lytic or blastic osseous lesions. Review of the MIP images confirms the above findings. CTA ABDOMEN AND PELVIS FINDINGS VASCULAR Dissection flap involving the proximal to mid splenic  artery (5:101) with irregularity and mild dilation of the downstream splenic artery (5:99). Left gastric artery arises directly from the aorta. Aortic atherosclerosis. Mild luminal narrowing of the proximal superior mesenteric artery (5:107) and segmental complete occlusion spanning 1.7 cm (10:102, 8:79) With distal reconstitution and poststenotic dilation measuring 7 mm (5:131). Graph Segmental mild-to-moderate narrowing of the bilateral common, external iliac, and proximal internal iliac arteries due to atherosclerotic plaque. Veins: No obvious venous abnormality within the limitations of this arterial phase study. Review of the MIP images confirms the above findings. NON-VASCULAR Hepatobiliary: Subcentimeter focus of arterial enhancement within segment 6 (5:119), too small to characterize but likely a flash filling hemangioma or perfusional variation. No intra or extrahepatic biliary ductal dilation. Normal gallbladder. Pancreas: No focal lesions or main ductal dilation. Spleen: Expected heterogeneous arterial enhancement of the spleen anteriorly. There is a relatively sharp demarcation with homogeneous hypoenhancement of the posteromedial spleen (5:92). Adrenals/Urinary Tract: No adrenal nodules. No suspicious renal mass, calculi or hydronephrosis. No focal bladder wall thickening. Stomach/Bowel: Normal appearance of the stomach. No evidence of bowel wall thickening, distention, or inflammatory changes. Appendix is not discretely seen. Lymphatic: No enlarged abdominal or pelvic lymph nodes. Reproductive: No adnexal masses. Other: No free fluid, fluid collection, or free air. Musculoskeletal: No acute or abnormal lytic or blastic osseous lesions. Review of the MIP images confirms the above findings. IMPRESSION: 1. Dissection flap involving the proximal to mid splenic artery with irregularity and mild dilation of the downstream splenic artery. 2. Sharply demarcated, homogeneous hypoenhancement of the posteromedial  spleen, suspicious for splenic infarction. 3. 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 4. No evidence of thoracic aortic aneurysm or dissection. 5. Aortic Atherosclerosis (ICD10-I70.0) and Emphysema (ICD10-J43.9). Coronary artery calcifications. Assessment for potential risk factor modification, dietary therapy or pharmacologic therapy may be warranted, if clinically indicated. These results will be called to the ordering clinician or representative by the Radiologist Assistant, and communication documented in the PACS or [employer]. Electronically Signed   By: Agustin Cree M.D.   On: 02/27/2024 14:21   DG Chest 2 View Result Date: 02/27/2024 CLINICAL DATA:  Abdominal pain and history of stomach ulcer EXAM: CHEST - 2 VIEW COMPARISON:  Chest radiograph dated 01/08/2017 FINDINGS: Normal lung volumes. Unchanged innumerable nodular densities throughout both lungs, which may reflect sequela of prior granulomatous infection. No new focal consolidations. No pleural effusion or pneumothorax. The heart size and mediastinal contours are within normal limits. No acute osseous abnormality. IMPRESSION: No active cardiopulmonary disease. Electronically Signed   By: Agustin Cree M.D.   On: 02/27/2024 10:10    Pertinent labs & imaging results that were available during my care of the patient were reviewed by me and considered in my medical decision making (see MDM for details).  Medications Ordered in ED Medications  heparin ADULT infusion 100 units/mL (25000 units/230mL) (900 Units/hr Intravenous New Bag/Given 02/27/24 1546)  HYDROmorphone (DILAUDID) injection 0.5 mg (0.5 mg Intravenous Given 02/27/24 0943)  sodium chloride 0.9 % bolus 1,000 mL (0 mLs Intravenous Stopped 02/27/24 1103)  ketorolac (TORADOL) 15 MG/ML injection 15 mg (  15 mg Intravenous Given 02/27/24 1059)  iohexol (OMNIPAQUE) 350 MG/ML injection 100 mL (100 mLs  Intravenous Contrast Given 02/27/24 1217)  HYDROmorphone (DILAUDID) injection 0.5 mg (0.5 mg Intravenous Given 02/27/24 1535)  heparin bolus via infusion 3,500 Units (3,500 Units Intravenous Bolus from Bag 02/27/24 1546)                                                                                                                                     Procedures .Critical Care  Performed by: Mordecai Applebaum, MD Authorized by: Mordecai Applebaum, MD   Critical care provider statement:    Critical care time (minutes):  30   Critical care was necessary to treat or prevent imminent or life-threatening deterioration of the following conditions:  Circulatory failure   Critical care was time spent personally by me on the following activities:  Development of treatment plan with patient or surrogate, discussions with consultants, evaluation of patient's response to treatment, examination of patient, ordering and review of laboratory studies, ordering and review of radiographic studies, ordering and performing treatments and interventions, pulse oximetry, re-evaluation of patient's condition and review of old charts   Care discussed with: admitting provider     (including critical care time)  Medical Decision Making / ED Course   MDM:  53 year old presenting to the emergency department with abdominal pain.  Patient overall well-appearing, though slightly uncomfortable.  Examination otherwise with generalized tenderness.  No focal tenderness.  Differential includes pancreatitis, gastritis, cholecystitis, perforation, obstruction, abscess, diverticulitis, volvulus, also includes dissection given radiation to the chest and back.  Will obtain imaging including CT scan, will obtain dissection protocol study to rule out dissection.  Lower concern for dissection but think needs to be ruled out.  Will treat pain and reassess.  Clinical Course as of 02/27/24 1604  Mon Feb 27, 2024  1603 CT scan shows  splenic artery dissection and splenic infarct.  Discussed with Dr. Fulton Job, vascular surgery.  He recommends medical admission, anticoagulation with heparin, he will consult, probably will just rescan the patient in a couple of days.  Discussed with internal medicine teaching service who have admitted the patient. [WS]    Clinical Course User Index [WS] Mordecai Applebaum, MD     Additional history obtained:  -External records from outside source obtained and reviewed including: Chart review including previous notes, labs, imaging, consultation notes including prior notes    Lab Tests: -I ordered, reviewed, and interpreted labs.   The pertinent results include:   Labs Reviewed  CBC - Abnormal; Notable for the following components:      Result Value   RBC 5.23 (*)    Hemoglobin 16.1 (*)    HCT 47.1 (*)    All other components within normal limits  I-STAT CHEM 8, ED - Abnormal; Notable for the following components:   Calcium, Ion 1.10 (*)    Hemoglobin 16.3 (*)  HCT 48.0 (*)    All other components within normal limits  BASIC METABOLIC PANEL WITH GFR  LIPASE, BLOOD  URINALYSIS, ROUTINE W REFLEX MICROSCOPIC  HEPARIN LEVEL (UNFRACTIONATED)  TROPONIN I (HIGH SENSITIVITY)  TROPONIN I (HIGH SENSITIVITY)    Notable for elevated RBC count  EKG   EKG Interpretation Date/Time:  Monday February 27 2024 08:58:02 EDT Ventricular Rate:  60 PR Interval:  133 QRS Duration:  86 QT Interval:  422 QTC Calculation: 422 R Axis:   85  Text Interpretation: Sinus rhythm Confirmed by Hiawatha Lout ([id number]) on 02/27/2024 9:18:12 AM         Imaging Studies ordered: I ordered imaging studies including CTA dissection study On my interpretation imaging demonstrates splenic artery dissection I independently visualized and interpreted imaging. I agree with the radiologist interpretation   Medicines ordered and prescription drug management: Meds ordered this encounter  Medications    HYDROmorphone (DILAUDID) injection 0.5 mg   sodium chloride 0.9 % bolus 1,000 mL   ketorolac (TORADOL) 15 MG/ML injection 15 mg   iohexol (OMNIPAQUE) 350 MG/ML injection 100 mL   HYDROmorphone (DILAUDID) injection 0.5 mg   heparin ADULT infusion 100 units/mL (25000 units/250mL)   heparin bolus via infusion 3,500 Units    -I have reviewed the patients home medicines and have made adjustments as needed   Consultations Obtained: I requested consultation with the vascular surgeon,  and discussed lab and imaging findings as well as pertinent plan - they recommend: admission   Cardiac Monitoring: The patient was maintained on a cardiac monitor.  I personally viewed and interpreted the cardiac monitored which showed an underlying rhythm of: NSR  Social Determinants of Health:  Diagnosis or treatment significantly limited by social determinants of health: current smoker   Reevaluation: After the interventions noted above, I reevaluated the patient and found that their symptoms have improved  Co morbidities that complicate the patient evaluation  Past Medical History:  Diagnosis Date   Ulcer       Dispostion: Disposition decision including need for hospitalization was considered, and patient admitted to the hospital.    Final Clinical Impression(s) / ED Diagnoses Final diagnoses:  Dissection of splenic artery (HCC)  Splenic infarct     This chart was dictated using voice recognition software.  Despite best efforts to proofread,  errors can occur which can change the documentation meaning.    Mordecai Applebaum, MD 02/27/24 [phone]

## 2024-02-27 NOTE — Consult Note (Addendum)
 Hospital Consult    Reason for Consult:  splenic artery dissection Requesting Physician:  ER MRN #:  [id number]  History of Present Illness: This is a 53 y.o. female who presented to the hospital with earlier today with abdominal pain.  She had a CTA a/p that revealed dissection flap in the proximal to mid splenic artery.  She was also found to have a segmental complete occlusion of the SMA with distal reconstitution.  Given the above findings, vascular surgery was consulted for further evaluation. She has been placed on a heparin gtt.   She states that she got up at 3am to get ready for work and her abdomen felt like she needed to pass gas.  She got to work and everything was fine until she had a sudden onset of severe pain that started mid abdomen and then radiated throughout her abdomen and chest.  She states that her blood pressure is normally on the low side but she does not check it regularly.  She has been under quite a bit of stress lately as well.    She states she tried to work through it for about 3 hours but her boss told her she needed to to go the ER or he was calling the ambulance.  She states that since the heparin has started the pain has eased off.  She denies any post prandial pain or nausea/vomiting.  She does not have fear of food.  In fact, she currently wants to eat.   Her only past medical hx is an ulcer and this resolved with a strict diet.  She denies any hx of MI or stroke.  She states she does have varicose veins and has had some ankle swelling.    She was not on any medications PTA.  Tobacco hx:  current  Past Medical History:  Diagnosis Date   Ulcer     Past Surgical History:  Procedure Laterality Date   TUBAL LIGATION     VARICOSE VEIN SURGERY      Allergies  Allergen Reactions   Penicillins Hives    Prior to Admission medications   Medication Sig Start Date End Date Taking? Authorizing Provider  cyclobenzaprine (FLEXERIL) 10 MG tablet Take 1 tablet  (10 mg total) by mouth 2 (two) times daily as needed for muscle spasms. 01/16/20   Fawze, Mina A, PA-C  HYDROcodone-acetaminophen (NORCO/VICODIN) 5-325 MG tablet Take 1 tablet by mouth every 6 (six) hours as needed for severe pain. 01/16/20   Fawze, Mina A, PA-C  ibuprofen (ADVIL) 600 MG tablet Take 1 tablet (600 mg total) by mouth every 6 (six) hours as needed. 01/16/20   Ivery Marking, PA-C    Social History   Socioeconomic History   Marital status: Single    Spouse name: Not on file   Number of children: Not on file   Years of education: Not on file   Highest education level: Not on file  Occupational History   Not on file  Tobacco Use   Smoking status: Every Day   Smokeless tobacco: Never  Substance and Sexual Activity   Alcohol use: Yes    Comment: occasionally   Drug use: No   Sexual activity: Yes    Birth control/protection: Surgical  Other Topics Concern   Not on file  Social History Narrative   Not on file   Social Drivers of Health   Financial Resource Strain: Not on file  Food Insecurity: Not on file  Transportation Needs: Not  on file  Physical Activity: Not on file  Stress: Not on file  Social Connections: Not on file  Intimate Partner Violence: Not on file     Family History  Problem Relation Age of Onset   Breast cancer Maternal Grandmother    Stroke Maternal Grandmother    Diabetes Paternal Uncle     ROS: [x]  Positive   [ ]  Negative   [ ]  All sytems reviewed and are negative  Cardiac: []  chest pain/pressure []  hx MI []  SOB   Vascular: []  pain in legs while walking []  pain in legs at rest []  pain in legs at night []  non-healing ulcers []  hx of DVT []  swelling in legs  Pulmonary: []  asthma/wheezing []  home O2  Neurologic: []  hx of CVA []  mini stroke   Hematologic: []  hx of cancer  Endocrine:   []  diabetes []  thyroid disease  GI []  GERD  GU: []  CKD/renal failure []  HD--[]  M/W/F or []  T/T/S  Psychiatric: []  anxiety []   depression  Musculoskeletal: []  arthritis []  joint pain  Integumentary: []  rashes []  ulcers  Constitutional: []  fever  []  chills  Physical Examination  Vitals:   02/27/24 1252 02/27/24 1303  BP: (!) 107/48   Pulse: 67   Resp: 18   Temp:  98.2 F (36.8 C)  SpO2: 100%    Body mass index is 20.25 kg/m.  General:  WDWN in NAD Gait: Not observed HENT: WNL, normocephalic Pulmonary: normal non-labored breathing Cardiac: regular Abdomen:  some tenderness in the LUQ Skin: without rashes Vascular Exam/Pulses:  Right Left  Radial 2+ (normal) 2+ (normal)  PT 2+ (normal) 2+ (normal)    Musculoskeletal: no muscle wasting or atrophy  Neurologic: A&O X 3 Psychiatric:  The pt has Normal affect.   CBC    Component Value Date/Time   WBC 8.6 02/27/2024 0902   RBC 5.23 (H) 02/27/2024 0902   HGB 16.3 (H) 02/27/2024 0920   HCT 48.0 (H) 02/27/2024 0920   PLT 166 02/27/2024 0902   MCV 90.1 02/27/2024 0902   MCH 30.8 02/27/2024 0902   MCHC 34.2 02/27/2024 0902   RDW 11.8 02/27/2024 0902   LYMPHSABS 2.0 07/01/2010 0917   MONOABS 0.5 07/01/2010 0917   EOSABS 0.0 07/01/2010 0917   BASOSABS 0.0 07/01/2010 0917    BMET    Component Value Date/Time   NA 136 02/27/2024 0920   K 3.7 02/27/2024 0920   CL 100 02/27/2024 0920   CO2 25 02/27/2024 0902   GLUCOSE 96 02/27/2024 0920   BUN 10 02/27/2024 0920   CREATININE 0.60 02/27/2024 0920   CALCIUM 9.3 02/27/2024 0902   GFRNONAA >60 02/27/2024 0902   GFRAA  07/01/2010 0917    >60        The eGFR has been calculated using the MDRD equation. This calculation has not been validated in all clinical situations. eGFR's persistently <60 mL/min signify possible Chronic Kidney Disease.     Non-Invasive Vascular Imaging:   CTA a/p 02/27/2024: IMPRESSION: 1. Dissection flap involving the proximal to mid splenic artery with irregularity and mild dilation of the downstream splenic artery. 2. Sharply demarcated, homogeneous  hypoenhancement of the posteromedial spleen, suspicious for splenic infarction. 3. 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 4. No evidence of thoracic aortic aneurysm or dissection. 5. Aortic Atherosclerosis (ICD10-I70.0) and Emphysema (ICD10-J43.9). Coronary artery calcifications. Assessment for potential risk factor modification, dietary therapy or pharmacologic therapy may be warranted,  if clinically indicated.    ASSESSMENT/PLAN: This is a 53 y.o. female who came to the hospital admitted with abdominal pain and found to have a splenic artery dissection.     Splenic artery dissection -heparin gtt has been started and she subjectively feels better.  Discussed with pt that we would treat this conservatively with anticoagulation.  Discussed that the cause of splenic artery dissection a lot of times is unknown.  She states that her BP is normally on the lower side but she has been under some stress lately.  Would need good blood pressure control as well.   SMA occlusion -she denies any post prandial pain or fear of food.  She is asymptomatic and the SMA reconstitutes distally.    Varicose veins -discussed with pt that varicose veins are not dangerous and can cause some swelling.  Discussed that veins are different from arteries.  Also discussed that varicose veins would not cause her splenic artery dissection.   Current smoker -discussed importance of smoking cessation and puts her at risk for limb loss, heart attack, strokes, cancers.  Discussed that she is young and quitting smoking would certainly benefit her.   Ok for diet for pt - I have put in for a regular diet as she is requesting food and no procedure is planned as she will be treated conservatively.   -Dr. Fulton Job to evaluate pt and determine further plan   Maryanna Smart, PA-C [facility] [phone]  I have seen  and evaluated the patient. I agree with the PA note as documented above.  53 year old female presented with abdominal pain that started about 3 AM more on the left side.  CTA with evidence of splenic artery dissection with some infarction in the spleen and also segmental occlusion of the SMA with distal reconstitution.  On evaluation she denies any food fear and states she looks forward to eating.  She has had no significant weight loss.  No postprandial abdominal pain to suggest chronic mesenteric ischemia.  The SMA occlusion to me looks very chronic as there are multiple large collaterals reconstituting the vessel distally.    The splenic artery dissection does appear acute and there is some associated thrombus.  I have recommended IV heparin and will repeat CTA in 48 hours likely Wednesday.  She does need echocardiogram tomorrow for further cardioembolic workup.  Unclear etiology.  She does have underlying tobacco abuse with some diseased arteries on her CT.  She denies any associated trauma.  Young Hensen, MD [facility] [place] Office: [phone]

## 2024-02-27 NOTE — Plan of Care (Signed)

## 2024-02-27 NOTE — ED Triage Notes (Signed)
 Pt. Stated, At 0300 I started having stomach, back and chest pain. It comes in waves. Its like a cramping. Denies any N/V/D

## 2024-02-27 NOTE — ED Notes (Signed)
 Patient transported to CT

## 2024-02-28 ENCOUNTER — Inpatient Hospital Stay (HOSPITAL_COMMUNITY)

## 2024-02-28 DIAGNOSIS — K55069 Acute infarction of intestine, part and extent unspecified: Secondary | ICD-10-CM

## 2024-02-28 DIAGNOSIS — I7779 Dissection of other artery: Secondary | ICD-10-CM

## 2024-02-28 DIAGNOSIS — K921 Melena: Secondary | ICD-10-CM

## 2024-02-28 DIAGNOSIS — R519 Headache, unspecified: Secondary | ICD-10-CM | POA: Insufficient documentation

## 2024-02-28 DIAGNOSIS — F17210 Nicotine dependence, cigarettes, uncomplicated: Secondary | ICD-10-CM

## 2024-02-28 DIAGNOSIS — E785 Hyperlipidemia, unspecified: Secondary | ICD-10-CM | POA: Diagnosis present

## 2024-02-28 LAB — CBC
HCT: 45.2 % (ref 36.0–46.0)
HCT: 41.7 % (ref 36.0–46.0)
Hemoglobin: 15.5 g/dL — ABNORMAL HIGH (ref 12.0–15.0)
Hemoglobin: 14.5 g/dL (ref 12.0–15.0)
MCH: 30.8 pg (ref 26.0–34.0)
MCH: 30.8 pg (ref 26.0–34.0)
MCHC: 34.3 g/dL (ref 30.0–36.0)
MCHC: 34.8 g/dL (ref 30.0–36.0)
MCV: 89.9 fL (ref 80.0–100.0)
MCV: 88.5 fL (ref 80.0–100.0)
Platelets: 127 10*3/uL — ABNORMAL LOW (ref 150–400)
Platelets: 129 10*3/uL — ABNORMAL LOW (ref 150–400)
RBC: 5.03 MIL/uL (ref 3.87–5.11)
RBC: 4.71 MIL/uL (ref 3.87–5.11)
RDW: 11.8 % (ref 11.5–15.5)
RDW: 11.7 % (ref 11.5–15.5)
WBC: 6 10*3/uL (ref 4.0–10.5)
WBC: 6.6 10*3/uL (ref 4.0–10.5)
nRBC: 0 % (ref 0.0–0.2)
nRBC: 0 % (ref 0.0–0.2)

## 2024-02-28 LAB — HEMOGLOBIN A1C
Hgb A1c MFr Bld: 5.3 % (ref 4.8–5.6)
Mean Plasma Glucose: 105.41 mg/dL

## 2024-02-28 LAB — BASIC METABOLIC PANEL WITH GFR
Anion gap: 10 (ref 5–15)
BUN: 13 mg/dL (ref 6–20)
CO2: 27 mmol/L (ref 22–32)
Calcium: 9.7 mg/dL (ref 8.9–10.3)
Chloride: 101 mmol/L (ref 98–111)
Creatinine, Ser: 0.66 mg/dL (ref 0.44–1.00)
GFR, Estimated: >60 mL/min (ref >60)
Glucose, Bld: 107 mg/dL — ABNORMAL HIGH (ref 70–99)
Potassium: 4.2 mmol/L (ref 3.5–5.1)
Sodium: 138 mmol/L (ref 135–145)

## 2024-02-28 LAB — LIPID PANEL
Cholesterol: 259 mg/dL — ABNORMAL HIGH (ref 0–200)
HDL: 71 mg/dL (ref >40)
LDL Cholesterol: 174 mg/dL — ABNORMAL HIGH (ref 0–99)
Total CHOL/HDL Ratio: 3.6 ratio
Triglycerides: 69 mg/dL (ref <150)
VLDL: 14 mg/dL (ref 0–40)

## 2024-02-28 LAB — ECHOCARDIOGRAM COMPLETE BUBBLE STUDY
Area-P 1/2: 2.93 cm2
S' Lateral: 3.2 cm

## 2024-02-28 LAB — HEPARIN LEVEL (UNFRACTIONATED)
Heparin Unfractionated: 0.14 [IU]/mL — ABNORMAL LOW (ref 0.30–0.70)
Heparin Unfractionated: 0.35 [IU]/mL (ref 0.30–0.70)
Heparin Unfractionated: 0.44 [IU]/mL (ref 0.30–0.70)

## 2024-02-28 MED ORDER — PANTOPRAZOLE SODIUM 40 MG IV SOLR
40.0000 mg | Freq: Two times a day (BID) | INTRAVENOUS | Status: DC
  Administered 2024-02-28 – 2024-02-29 (×2): 40 mg via INTRAVENOUS
  Filled 2024-02-28 (×2): qty 10

## 2024-02-28 MED ORDER — NICOTINE 14 MG/24HR TD PT24
14.0000 mg | MEDICATED_PATCH | Freq: Every day | TRANSDERMAL | Status: DC
  Filled 2024-02-28: qty 1

## 2024-02-28 NOTE — Plan of Care (Signed)
  Problem: Education: Goal: Knowledge of General Education information will improve Description: Including pain rating scale, medication(s)/side effects and non-pharmacologic comfort measures Outcome: Progressing   Problem: Health Behavior/Discharge Planning: Goal: Ability to manage health-related needs will improve Outcome: Progressing   Problem: Clinical Measurements: Goal: Ability to maintain clinical measurements within normal limits will improve Outcome: Progressing Goal: Will remain free from infection Outcome: Progressing Goal: Diagnostic test results will improve Outcome: Progressing   Problem: Activity: Goal: Risk for activity intolerance will decrease Outcome: Progressing   Problem: Elimination: Goal: Will not experience complications related to urinary retention Outcome: Progressing   Problem: Pain Managment: Goal: General experience of comfort will improve and/or be controlled Outcome: Progressing

## 2024-02-28 NOTE — Progress Notes (Signed)
 PHARMACY - ANTICOAGULATION CONSULT NOTE  Pharmacy Consult for heparin Indication:  splenic artery dissection and SMA occlusion  Labs: Recent Labs    02/27/24 0902 02/27/24 0920 02/27/24 1104 02/28/24 0029  HGB 16.1* 16.3*  --   --   HCT 47.1* 48.0*  --   --   PLT 166  --   --   --   HEPARINUNFRC  --   --   --  0.35  CREATININE 0.67 0.60  --   --   TROPONINIHS 4  --  5  --    Assessment: 53yo female therapeutic on heparin with initial dosing for splenic artery dissection and SMA occlusion but at low end of goal; no infusion issues or signs of bleeding per RN.  Goal of Therapy:  Heparin level 0.3-0.7 units/ml   Plan:  Increase heparin infusion slightly to 950 units/hr. Check level in 6 hours.   Lonnie Roberts, PharmD, BCPS 02/28/2024 1:04 AM

## 2024-02-28 NOTE — Progress Notes (Signed)
 [facility]  Subjective  -abdominal pain resolved this morning.   Objective 116/70 (!) 53 98.2 F (36.8 C) (Oral) 17 98%  Intake/Output Summary (Last 24 hours) at 02/28/2024 0642 Last data filed at 02/27/2024 1856 Gross per 24 hour  Intake 302.99 ml  Output --  Net 302.99 ml    Abdomen soft with no significant tenderness  Laboratory Lab Results: Recent Labs    02/27/24 0902 02/27/24 0920  WBC 8.6  --   HGB 16.1* 16.3*  HCT 47.1* 48.0*  PLT 166  --    BMET Recent Labs    02/27/24 0902 02/27/24 0920  NA 137 136  K 3.9 3.7  CL 101 100  CO2 25  --   GLUCOSE 96 96  BUN 9 10  CREATININE 0.67 0.60  CALCIUM 9.3  --     COAG No results found for: "INR", "PROTIME" No results found for: "PTT"  Assessment/Planning:  53 year old female seen yesterday in the ED with acute onset abdominal pain.  Splenic artery dissection with splenic infarcts that appears acute.  Recommend continuing heparin and will repeat CTA tomorrow.  Does need echocardiogram today for completion of cardioembolic workup and discussed unclear etiology for splenic dissection.    There was also an incidental finding on CT of segmental occlusion of the SMA but this vessel reconstitutes distally with large collaterals.  This appears chronic to me.  She is tolerating food and has no signs or symptoms of chronic mesenteric ischemia.  I do not think this requires any intervention at this time.  Young Hensen 02/28/2024 6:42 AM --

## 2024-02-28 NOTE — Plan of Care (Signed)

## 2024-02-28 NOTE — Progress Notes (Signed)
  Echocardiogram 2D Echocardiogram has been performed.  Diane Holmes 02/28/2024, 2:52 PM

## 2024-02-28 NOTE — Progress Notes (Signed)
 ANTICOAGULATION CONSULT NOTE  Pharmacy Consult for Heparin Indication:  splenic artery dissection/occlusion  Allergies  Allergen Reactions   Penicillins Hives    Reported as childhood allergy, told by parent that she got hives and had trouble breathing    Patient Measurements: Height: 5\' 4"  (162.6 cm) Weight: 53.5 kg (118 lb) IBW/kg (Calculated) : 54.7 Heparin Dosing Weight: 54 kg  Vital Signs: Temp: 98.8 F (37.1 C) (04/15 0807) Temp Source: Oral (04/15 0807) BP: 107/66 (04/15 0807) Pulse Rate: 67 (04/15 0807)  Labs: Recent Labs    02/27/24 0902 02/27/24 0920 02/27/24 1104 02/28/24 0029 02/28/24 0642 02/28/24 0809  HGB 16.1* 16.3*  --   --  15.5*  --   HCT 47.1* 48.0*  --   --  45.2  --   PLT 166  --   --   --  127*  --   HEPARINUNFRC  --   --   --  0.35  --  0.44  CREATININE 0.67 0.60  --   --  0.66  --   TROPONINIHS 4  --  5  --   --   --     Estimated Creatinine Clearance: 69.5 mL/min (by C-G formula based on SCr of 0.66 mg/dL).  Assessment: 25 yof presenting with abdominal pain. Heparin per pharmacy consult placed for  splenic artery dissection/occlusion . Patient was not on anticoagulation prior to arrival.  Hgb 15.5, platelets low normal on admit 166 > 127. No issue with bleeding or infusion issues reported.  Goal of Therapy:  Heparin level 0.3-0.7 units/ml Monitor platelets by anticoagulation protocol: Yes   Plan:  Continue heparin infusion at 950 units/hr Check heparin level in 8 hours and daily while on heparin Continue to monitor H&H and platelets  Thank you for allowing pharmacy to be a part of this patient's care.  Claudia Cuff, PharmD, BCPS Clinical Pharmacist

## 2024-02-28 NOTE — Progress Notes (Addendum)
   Subjective:  [name] today feels well and raises no new complaints. She expressed interest in going outside so she can have a smoke break.  Objective:  Vital signs in last 24 hours: Vitals:   02/27/24 1806 02/27/24 2003 02/28/24 0037 02/28/24 0537  BP: (!) 115/59 (!) 97/58 (!) 102/57 116/70  Pulse: 68 66 62 (!) 53  Resp: 18 16 16 17   Temp: 98.5 F (36.9 C) 98.1 F (36.7 C) 98.1 F (36.7 C) 98.2 F (36.8 C)  TempSrc: Oral Oral Oral Oral  SpO2: 97% 97% 97% 98%  Weight:      Height:       Weight change:   Intake/Output Summary (Last 24 hours) at 02/28/2024 1610 Last data filed at 02/27/2024 1856 Gross per 24 hour  Intake 302.99 ml  Output --  Net 302.99 ml    GEN: No acute distress, laying in bed talking on the phone. Comfortable, moving freely CV: Well perfused PULM: Breathing comfortably NEURO: A&O  Assessment/Plan:  Patient Summary [name] is a 53 y.o. female with a 38 pack year smoking history and no significant past medical history who presents to the ED with abdominal pain found to have splenic artery dissection, splenic infarction, and complete SMA obstruction with collaterals now on heparin treatment.  Principal Problem   Dissection of splenic artery [facility])   Splenic infarction Vascular surgery and pharmacy managing surgical and medical treatment, appreciate their recommendations. Cardioembolic workup proceeding. - Target heparin level is 0.3-0.7 U/ml, today up to 0.44 U/ml - Continue Heparin to 950 U/hr - Echocardiogram today - CTA tomorrow  Active Problems   Tobacco use disorder Imperative to counsel patient on tobacco cessation. She showed interest in smoking today, though she recognizes this is likely contributing to her presentation. - Nicotine cessation counseling - 21 mg nicotine patch every day     Occlusion of superior mesenteric artery (HCC)   Hyperlipidemia Appears chronic as there are large collaterals. Lipid panel  shows cholesterol 259, LDL 174. - Start atorvastain 20 mg tomorrow AM    Black tarry stools Differential includes recurrence of gastric ulcer vs small bowel ischemia secondary to SMA obstruction. Deserves outpatient follow up. Denies pain when eating, or fear of eating. Important to follow up to see if she has worsening symptoms while on heparin for dissection, especially with history of gastric ulcer. - FOBT today to evaluate for heme in stool - PCP on discharge - Monitor for BRBPR and other signs of GI bleeding  Diet: Regular VTE: Heparin Code: Full   LOS: 1 day   Ferdinand Houseman, Medical Student 02/28/2024, 6:49 AM

## 2024-02-28 NOTE — Progress Notes (Signed)
 ANTICOAGULATION CONSULT NOTE  Pharmacy Consult for Heparin Indication:  splenic artery dissection/occlusion  Allergies  Allergen Reactions   Penicillins Hives    Reported as childhood allergy, told by parent that she got hives and had trouble breathing    Patient Measurements: Height: 5\' 4"  (162.6 cm) Weight: 53.5 kg (118 lb) IBW/kg (Calculated) : 54.7 Heparin Dosing Weight: 54 kg  Vital Signs: Temp: 98.8 F (37.1 C) (04/15 0807) Temp Source: Oral (04/15 0807) BP: 107/66 (04/15 0807) Pulse Rate: 67 (04/15 0807)  Labs: Recent Labs    02/27/24 0902 02/27/24 0920 02/27/24 1104 02/28/24 0029 02/28/24 0642 02/28/24 0809 02/28/24 1811  HGB 16.1* 16.3*  --   --  15.5*  --  14.5  HCT 47.1* 48.0*  --   --  45.2  --  41.7  PLT 166  --   --   --  127*  --  129*  HEPARINUNFRC  --   --   --  0.35  --  0.44 0.14*  CREATININE 0.67 0.60  --   --  0.66  --   --   TROPONINIHS 4  --  5  --   --   --   --     Estimated Creatinine Clearance: 69.5 mL/min (by C-G formula based on SCr of 0.66 mg/dL).  Assessment: 63 yof presenting with abdominal pain. Heparin per pharmacy consult placed for  splenic artery dissection/occlusion . Patient was not on anticoagulation prior to arrival.  Heparin level 0.14 is subtherapeutic on 950units/hr. No issues with infusion or bleeding per night RN. Day shift RN changed the location of heparin infusion from L AC to now running in L forearm and level drawn from R hand. No pauses or excess IV pump beeping per RN.   Goal of Therapy:  Heparin level 0.3-0.7 units/ml Monitor platelets by anticoagulation protocol: Yes   Plan:  Increase heparin infusion to 1050units/hr Monitor daily heparin level, CBC, signs/symptoms of bleeding    Thank you for allowing pharmacy to be a part of this patient's care.  Dorene Gang, PharmD, BCPS, BCCP Clinical Pharmacist  Please check AMION for all [facility] Pharmacy phone numbers After 10:00 PM, call Main Pharmacy [phone]

## 2024-02-29 ENCOUNTER — Other Ambulatory Visit (HOSPITAL_COMMUNITY): Payer: Self-pay

## 2024-02-29 ENCOUNTER — Telehealth (HOSPITAL_COMMUNITY): Payer: Self-pay | Admitting: Pharmacy Technician

## 2024-02-29 ENCOUNTER — Inpatient Hospital Stay (HOSPITAL_COMMUNITY)

## 2024-02-29 DIAGNOSIS — F17200 Nicotine dependence, unspecified, uncomplicated: Secondary | ICD-10-CM

## 2024-02-29 DIAGNOSIS — D696 Thrombocytopenia, unspecified: Secondary | ICD-10-CM | POA: Insufficient documentation

## 2024-02-29 LAB — CBC
HCT: 44.9 % (ref 36.0–46.0)
Hemoglobin: 15 g/dL (ref 12.0–15.0)
MCH: 29.8 pg (ref 26.0–34.0)
MCHC: 33.4 g/dL (ref 30.0–36.0)
MCV: 89.1 fL (ref 80.0–100.0)
Platelets: 145 10*3/uL — ABNORMAL LOW (ref 150–400)
RBC: 5.04 MIL/uL (ref 3.87–5.11)
RDW: 11.8 % (ref 11.5–15.5)
WBC: 5.6 10*3/uL (ref 4.0–10.5)
nRBC: 0 % (ref 0.0–0.2)

## 2024-02-29 LAB — HEPARIN LEVEL (UNFRACTIONATED): Heparin Unfractionated: 0.57 [IU]/mL (ref 0.30–0.70)

## 2024-02-29 MED ORDER — ACETAMINOPHEN 500 MG PO TABS: 1000.0000 mg | ORAL_TABLET | Freq: Three times a day (TID) | ORAL | 0 refills | Status: AC

## 2024-02-29 MED ORDER — PANTOPRAZOLE SODIUM 40 MG PO TBEC: 40.0000 mg | DELAYED_RELEASE_TABLET | Freq: Two times a day (BID) | ORAL | 0 refills | Status: AC

## 2024-02-29 MED ORDER — IOHEXOL 350 MG/ML SOLN
75.0000 mL | Freq: Once | INTRAVENOUS | Status: AC | PRN
  Administered 2024-02-29: 75 mL via INTRAVENOUS

## 2024-02-29 MED ORDER — APIXABAN 5 MG PO TABS
5.0000 mg | ORAL_TABLET | Freq: Two times a day (BID) | ORAL | Status: DC
  Administered 2024-02-29: 5 mg via ORAL
  Filled 2024-02-29: qty 1

## 2024-02-29 MED ORDER — ONDANSETRON HCL 4 MG PO TABS
8.0000 mg | ORAL_TABLET | Freq: Three times a day (TID) | ORAL | Status: DC | PRN
  Administered 2024-02-29: 8 mg via ORAL
  Filled 2024-02-29: qty 2

## 2024-02-29 MED ORDER — OXYCODONE HCL 5 MG PO TABS: 5.0000 mg | ORAL_TABLET | ORAL | 0 refills | Status: DC | PRN

## 2024-02-29 MED ORDER — ACETAMINOPHEN 500 MG PO TABS
1000.0000 mg | ORAL_TABLET | Freq: Three times a day (TID) | ORAL | 0 refills | Status: DC
  Filled 2024-02-29: qty 30, 5d supply, fill #0

## 2024-02-29 MED ORDER — PANTOPRAZOLE SODIUM 40 MG PO TBEC: 40.0000 mg | DELAYED_RELEASE_TABLET | Freq: Two times a day (BID) | ORAL | Status: DC

## 2024-02-29 MED ORDER — OXYCODONE HCL 5 MG PO TABS
5.0000 mg | ORAL_TABLET | ORAL | 0 refills | Status: DC | PRN
  Filled 2024-02-29: qty 10, 2d supply, fill #0

## 2024-02-29 MED ORDER — APIXABAN 5 MG PO TABS: 5.0000 mg | ORAL_TABLET | Freq: Two times a day (BID) | ORAL | Status: DC

## 2024-02-29 MED ORDER — ONDANSETRON HCL 4 MG PO TABS
4.0000 mg | ORAL_TABLET | Freq: Three times a day (TID) | ORAL | 0 refills | Status: DC | PRN
Start: 2024-02-29 — End: 2024-10-09

## 2024-02-29 MED ORDER — APIXABAN 5 MG PO TABS: 5.0000 mg | ORAL_TABLET | Freq: Two times a day (BID) | ORAL | 1 refills | Status: DC

## 2024-02-29 MED ORDER — PANTOPRAZOLE SODIUM 40 MG PO TBEC
40.0000 mg | DELAYED_RELEASE_TABLET | Freq: Two times a day (BID) | ORAL | 0 refills | Status: DC
Start: 2024-02-29 — End: 2024-02-29
  Filled 2024-02-29: qty 28, 14d supply, fill #0

## 2024-02-29 MED ORDER — ONDANSETRON HCL 4 MG PO TABS
4.0000 mg | ORAL_TABLET | Freq: Three times a day (TID) | ORAL | 0 refills | Status: DC | PRN
Start: 2024-02-29 — End: 2024-02-29
  Filled 2024-02-29: qty 30, 5d supply, fill #0

## 2024-02-29 NOTE — Progress Notes (Signed)
   Subjective:  [name] today feels well. She says that her pain is well controlled when she has medications. She has not had a bowel movement since coming to the hospital. She denies pain when eating, or abdominal pain improving after eating.  Objective:  Vital signs in last 24 hours: Vitals:   02/28/24 0537 02/28/24 0807 02/28/24 2054 02/29/24 0428  BP: 116/70 107/66 114/63 (!) 112/54  Pulse: (!) 53 67 (!) 53 (!) 53  Resp: 17 18 12 16   Temp: 98.2 F (36.8 C) 98.8 F (37.1 C) 98.2 F (36.8 C) 97.7 F (36.5 C)  TempSrc: Oral Oral Oral Oral  SpO2: 98% 99% 97% 96%  Weight:      Height:       Weight change:   Intake/Output Summary (Last 24 hours) at 02/29/2024 0614 Last data filed at 02/28/2024 1938 Gross per 24 hour  Intake 445.55 ml  Output --  Net 445.55 ml   GEN: No acute distress, lying comfortably in bed, converses without issue, ABD: tender to palpation in RUQ, epigastrum, L abdomen, and most tender in LLQ. Nonperitonitic, no rebound tenderness, examined after oxycodone 5 mg, tylenol 1000 mg, and ondansetron 8 mg  Assessment/Plan:  Patient Summary [name] is a 53 y.o. female with a 38 pack year smoking history and no significant past medical history who presents to the ED with abdominal pain found to have splenic artery dissection, splenic infarction, and complete SMA obstruction with collaterals now on heparin treatment.  Principal Problem   Dissection of splenic artery [facility])   Splenic infarction Vascular surgery and pharmacy managing surgical and medical treatment, appreciate their recommendations. Cardioembolic workup proceeding. - Discontinue heparin - Echocardiogram today - CTA today  Active Problems   Tobacco use disorder Imperative to counsel patient on tobacco cessation. She showed interest in smoking today, though she recognizes this is likely contributing to her presentation. She refused her nicotine patch twice. Patient is  counseled daily about quitting tobacco. - Nicotine cessation counseling - 14 mg nicotine patch every day    Occlusion of superior mesenteric artery (HCC)   Hyperlipidemia Appears chronic as there are large collaterals. Lipid panel shows cholesterol 259, LDL 174. ASCVD risk is 3.6%. Statin therapy is not indicated at ASCVD risk less than 5%.    Black tarry stools Differential includes recurrence of gastric ulcer vs small bowel ischemia secondary to SMA obstruction. Deserves outpatient follow up. Denies pain when eating, or fear of eating. Set up PCP outpatient visit next week. Epigastric pain consistent with gastric ulcer. - Pantoprazole 40 mg every day  - FOBT today to evaluate for heme in stool - PCP scheduled next week - Monitor for BRBPR and other signs of GI bleeding  Diet: Regular VTE: Heparin Code: Full   LOS: 2 days   Ferdinand Houseman, Medical Student 02/29/2024, 6:14 AM

## 2024-02-29 NOTE — Discharge Instructions (Addendum)
 You were hospitalized for a tear in the blood vessel that provides blood to your spleen. At the time of discharge, you were feeling much better. Thank you for allowing us  to be part of your care.   We arranged for you to follow up at:   Dr. Cathey Clunes [facility] [address] [place], [place], [place] [address], [phone] March 05, 2024 at 9:15 AM  Please note these changes made to your medications:   *Please START taking:  Eliquis (apixaban) 5 mg twice daily Pantoprazole, 40 mg, every morning and night with meals Tylenol 1000 mg, every 8 hours for pain as needed Oxycodone 5 mg, every 4 hours as needed for moderate pain Ondansetron 8 mg, every 8 hours as needed for nausea  Please make sure to return to the emergency room if you experience any of the following symptoms: unbearable abdominal pain, abdominal pain that does not respond to pain medication, severe fatigue or passing out, large amount of red blood in your stool, or any bleeding that does not stop within 30 minutes.  Please call our clinic if you have any questions or concerns, we may be able to help and keep you from a long and expensive emergency room wait. Our clinic and after hours phone number is [phone], the best time to call is Monday through Friday 9 am to 4 pm but there is always someone available 24/7 if you have an emergency. If you need medication refills please notify your pharmacy one week in advance and they will send us  a request.

## 2024-02-29 NOTE — Progress Notes (Signed)
 Diane Holmes to be D/C'd  per MD order.  Discussed with the patient and all questions fully answered.  VSS, Skin clean, dry and intact without evidence of skin break down, no evidence of skin tears noted.  IV catheter discontinued intact. Site without signs and symptoms of complications. Dressing and pressure applied.  An After Visit Summary was printed and given to the patient. Patient received prescription.  D/c education completed with patient/family including follow up instructions, medication list, d/c activities limitations if indicated, with other d/c instructions as indicated by MD - patient able to verbalize understanding, all questions fully answered.   Patient instructed to return to ED, call 911, or call MD for any changes in condition.   Patient to be escorted via WC, and D/C home via private auto.

## 2024-02-29 NOTE — Progress Notes (Signed)
 ANTICOAGULATION CONSULT NOTE  Pharmacy Consult for Heparin Indication:  splenic artery dissection/occlusion  Allergies  Allergen Reactions   Penicillins Hives    Reported as childhood allergy, told by parent that she got hives and had trouble breathing    Patient Measurements: Height: 5\' 4"  (162.6 cm) Weight: 53.5 kg (118 lb) IBW/kg (Calculated) : 54.7 Heparin Dosing Weight: 54 kg  Vital Signs: Temp: 98.6 F (37 C) (04/16 0751) Temp Source: Oral (04/16 0751) BP: 119/77 (04/16 0751) Pulse Rate: 68 (04/16 0751)  Labs: Recent Labs    02/27/24 0902 02/27/24 0920 02/27/24 1104 02/28/24 0029 02/28/24 0642 02/28/24 0809 02/28/24 1811 02/29/24 0629  HGB 16.1* 16.3*  --   --  15.5*  --  14.5  --   HCT 47.1* 48.0*  --   --  45.2  --  41.7  --   PLT 166  --   --   --  127*  --  129*  --   HEPARINUNFRC  --   --   --    < >  --  0.44 0.14* 0.57  CREATININE 0.67 0.60  --   --  0.66  --   --   --   TROPONINIHS 4  --  5  --   --   --   --   --    < > = values in this interval not displayed.    Estimated Creatinine Clearance: 69.5 mL/min (by C-G formula based on SCr of 0.66 mg/dL).  Assessment: 53 yof presenting with abdominal pain. Heparin per pharmacy consult placed for  splenic artery dissection/occlusion .   Heparin level therapeutic at 0.57 units/mL.  CBC not yet collected.  No bleeding reported.  Goal of Therapy:  Heparin level 0.3-0.7 units/ml Monitor platelets by anticoagulation protocol: Yes   Plan:  Continue heparin infusion at 1050 units/hr Monitor daily heparin level, CBC, signs/symptoms of bleeding   Emileo Semel D. Marikay Show, PharmD, BCPS, BCCCP 02/29/2024, 10:07 AM

## 2024-02-29 NOTE — Telephone Encounter (Signed)
 Patient Product/process development scientist completed.    The patient is insured through [facility]. Patient has [employer], may use a copay card, and/or apply for patient assistance if available.    Ran test claim for Eliquis 5 mg and the current 30 day co-pay is $0.00.   This test claim was processed through [facility]- copay amounts may vary at other pharmacies due to pharmacy/plan contracts, or as the patient moves through the different stages of their insurance plan.     Roland Earl, CPHT Pharmacy Technician III Certified Patient Advocate [facility] Patient Advocate Team Direct Number: [phone]  Fax: [phone]

## 2024-02-29 NOTE — Discharge Summary (Addendum)
 Name: Diane Holmes MRN: [id number] DOB: 1971/06/24 53 y.o. PCP: Patient, No Pcp Per,   Date of Admission: 02/27/2024  8:41 AM Date of Discharge: 02/29/2024 Attending Physician: Dr. Mayford Knife  Discharge Diagnosis: Principal Problem:   Dissection vs atherosclerotic occlusion of splenic artery [facility]) Active Problems:   Splenic infarction   Tobacco use disorder   Occlusion of superior mesenteric artery (HCC)   Black tarry stools (prior to admission; reported, not observed)   Hyperlipidemia, new diagnosis    Discharge Medications: Allergies as of 02/29/2024       Reactions   Penicillins Hives   Reported as childhood allergy, told by parent that she got hives and had trouble breathing        Medication List     TAKE these medications    acetaminophen 500 MG tablet Commonly known as: TYLENOL Take 2 tablets (1,000 mg total) by mouth every 8 (eight) hours.   apixaban 5 MG Tabs tablet Commonly known as: ELIQUIS Take 1 tablet (5 mg total) by mouth 2 (two) times daily.   ondansetron 4 MG tablet Commonly known as: ZOFRAN Take 1-2 tablets (4-8 mg total) by mouth every 8 (eight) hours as needed for nausea or vomiting.   oxyCODONE 5 MG immediate release tablet Commonly known as: Oxy IR/ROXICODONE Take 1 tablet (5 mg total) by mouth every 4 (four) hours as needed for moderate pain (pain score 4-6).   pantoprazole 40 MG tablet Commonly known as: PROTONIX Take 1 tablet (40 mg total) by mouth 2 (two) times daily for 14 days.        Disposition and follow-up:   [name] was discharged from [facility] in Good condition.  At the hospital follow up visit please address:  1.  Follow-up:   a. Tobacco use disorder  b. Reported black tarry stool, with PMH of gastric ulcer (between 2011-2021) and epigastric pain    c. Follow up on need for vascular outpatient follow up.   d. Hyperlipidemia, cholesterol 259, LDL 174. ASCVD risk 3.6% but with  evidence of splanchnic atherosclerosis. Shared decision making for starting a statin.   e. Health care maintenance, has not been to a PCP for many years   1. Mammogram   2. Colon cancer screening   3. Cervical cancer screening   4. Pneumococcal vaccination   f. CXR and Chest CT: Innumerable punctate granulomata in both lungs with R tubular calcification. Repeat non-con CT in 12 months.  2.  Labs / imaging needed at time of follow-up:    a. CBC  Follow-up Appointments:  Follow-up Information     Morene Crocker, MD. Go on 03/05/2024.   Specialty: Internal Medicine Why: March 05, 2024, at 9:15 AM Contact information: [address] [place] [address] [phone]                 Hospital Course by problem list: [name] is a 53 y.o. female with a 38 pack year smoking history and no significant past medical history who presents to [facility] ED with abdominal pain found to have splenic artery dissection, splenic infarction and SMA obstruction then admitted for anticoagulation, management of vascular injuries, and cardioembolic workup.   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 Patient received 1 L NS bolus, 0.5 mg hydromorphone, and heparin gtt and symptoms improved. CTA in ED showed mid splenic artery dissection, changes consistent with splenic infarction, complete SMA  occlusion with significant collaterals, no aortic dissection, and a number of chronic changes: emphysema, aortic atherosclerosis and coronary calcifications. Vascular surgery was consulted and recommended heparin with repeat CTA in 2 days. Echocardiogram shows normal EF, no vegetations, no interatrial shunt and dilated IVC. Repeat CT showed the splenic artery thrombus was likely more chronic and the dissection flap seen on admission is more likely mixed density clot. She was transitioned from heparin to eliquis  prior to discharge. She will follow up with the [facility] and would need clarification of the need for vascular follow up.  Tobacco use disorder  Pulmonary Nodules CTA chest demonstrated moderate emphysematous changes in the upper lobes with multiple pulmonary nodules and scattered calcified granulomas. Patient was counseled about the importance of smoking cessation. Nicotine patches were made available to the patient which were refused. Non-con CT chest recommended in 12 months.  Occlusion of superior mesenteric artery (HCC) Hyperlipidemia CTA showed that the SMA is completely occluded, but with extensive collateralization. Lipid panel shows cholesterol 259, LDL 174. ASCVD risk 3.6%. Unknown utility of cholesterol lowering therapy in this patient with low ASCVD. Did not start statin here but do think it would be reasonable to start if the patient wants.  Black tarry stools  On presentation patient, supported by boyfriend, endorsed that the patient has intermittent black tarry stools for around 3 months. Her Hgb was 16.1 on admission and 14.5 the next day. She was monitored for changes in stool while inpatient with no reported stools.   Discharge Subjective: [name] feels well. She is able to eat and drink without pain. Her pain in her abdomen is much better after receiving pain medication. She has not had a bowel movement since coming to the hospital. She wants to go outside to smoke.  Discharge Exam:   BP 98/64 (BP Location: Right Arm)   Pulse 65   Temp 98.8 F (37.1 C) (Oral)   Resp 18   Ht 5\' 4"  (1.626 m)   Wt 53.5 kg   LMP 01/24/2013   SpO2 99%   BMI 20.25 kg/m  Constitutional: well-appearing, in no acute distress,lying comfortably in bed, converses without issue Cardiovascular: regular rate and rhythm, no m/r/g Pulmonary/Chest: normal work of breathing on room air Abdominal: tender to palpation in RUQ, epigastrum, L abdomen, and most tender in LLQ. Nonperitonitic, no  rebound tenderness. Neurological: alert & oriented x 3 Skin: warm and dry  Pertinent Labs, Studies, and Procedures:     Latest Ref Rng & Units 02/29/2024   10:48 AM 02/28/2024    6:11 PM 02/28/2024    6:42 AM  CBC  WBC 4.0 - 10.5 K/uL 5.6  6.6  6.0   Hemoglobin 12.0 - 15.0 g/dL 78.2  95.6  21.3   Hematocrit 36.0 - 46.0 % 44.9  41.7  45.2   Platelets 150 - 400 K/uL 145  129  127        Latest Ref Rng & Units 02/28/2024    6:42 AM 02/27/2024    9:20 AM 02/27/2024    9:02 AM  CMP  Glucose 70 - 99 mg/dL 086  96  96   BUN 6 - 20 mg/dL 13  10  9    Creatinine 0.44 - 1.00 mg/dL 5.78  4.69  6.29   Sodium 135 - 145 mmol/L 138  136  137   Potassium 3.5 - 5.1 mmol/L 4.2  3.7  3.9   Chloride 98 - 111 mmol/L 101  100  101  CO2 22 - 32 mmol/L 27   25   Calcium 8.9 - 10.3 mg/dL 9.7   9.3     ECHOCARDIOGRAM COMPLETE BUBBLE STUDY Result Date: 02/28/2024    ECHOCARDIOGRAM REPORT   Patient Name:   ANUSHREE DORSI Date of Exam: 02/28/2024 Medical Rec #:  [id number]        Height:       64.0 in Accession #:    [id number]       Weight:       118.0 lb Date of Birth:  1971-03-16       BSA:          1.563 m Patient Age:    52 years         BP:           107/66 mmHg Patient Gender: F                HR:           55 bpm. Exam Location:  Inpatient Procedure: 2D Echo and Saline Contrast Bubble Study (Both Spectral and Color            Flow Doppler were utilized during procedure). Indications:    splenic infarct  History:        Patient has no prior history of Echocardiogram examinations.                 Risk Factors:Current Smoker.  Sonographer:    Delcie Roch RDCS Referring Phys: [id number] JULIE ANNE WILLIAMS IMPRESSIONS  1. Left ventricular ejection fraction, by estimation, is 60 to 65%. The left ventricle has normal function. The left ventricle has no regional wall motion abnormalities. Left ventricular diastolic parameters were normal.  2. Right ventricular systolic function is normal. The right ventricular size  is normal. There is normal pulmonary artery systolic pressure. The estimated right ventricular systolic pressure is 26.3 mmHg.  3. The mitral valve is normal in structure. No evidence of mitral valve regurgitation. No evidence of mitral stenosis.  4. The aortic valve is normal in structure. Aortic valve regurgitation is not visualized. No aortic stenosis is present.  5. The inferior vena cava is dilated in size with >50% respiratory variability, suggesting right atrial pressure of 8 mmHg.  6. Agitated saline contrast bubble study was negative, with no evidence of any interatrial shunt. FINDINGS  Left Ventricle: Left ventricular ejection fraction, by estimation, is 60 to 65%. The left ventricle has normal function. The left ventricle has no regional wall motion abnormalities. The left ventricular internal cavity size was normal in size. There is  no left ventricular hypertrophy. Left ventricular diastolic parameters were normal. Right Ventricle: The right ventricular size is normal. No increase in right ventricular wall thickness. Right ventricular systolic function is normal. There is normal pulmonary artery systolic pressure. The tricuspid regurgitant velocity is 2.14 m/s, and  with an assumed right atrial pressure of 8 mmHg, the estimated right ventricular systolic pressure is 26.3 mmHg. Left Atrium: Left atrial size was normal in size. Right Atrium: Right atrial size was normal in size. Pericardium: There is no evidence of pericardial effusion. Mitral Valve: The mitral valve is normal in structure. No evidence of mitral valve regurgitation. No evidence of mitral valve stenosis. Tricuspid Valve: The tricuspid valve is normal in structure. Tricuspid valve regurgitation is not demonstrated. No evidence of tricuspid stenosis. Aortic Valve: The aortic valve is normal in structure. Aortic valve regurgitation is not visualized. No aortic stenosis is present. Pulmonic Valve:  The pulmonic valve was normal in structure.  Pulmonic valve regurgitation is not visualized. No evidence of pulmonic stenosis. Aorta: The aortic root is normal in size and structure. Venous: The inferior vena cava is dilated in size with greater than 50% respiratory variability, suggesting right atrial pressure of 8 mmHg. IAS/Shunts: No atrial level shunt detected by color flow Doppler. Agitated saline contrast was given intravenously to evaluate for intracardiac shunting. Agitated saline contrast bubble study was negative, with no evidence of any interatrial shunt.  LEFT VENTRICLE PLAX 2D LVIDd:         4.70 cm Diastology LVIDs:         3.20 cm LV e' medial:    11.20 cm/s LV PW:         0.90 cm LV E/e' medial:  8.9 LV IVS:        0.70 cm LV e' lateral:   10.80 cm/s                        LV E/e' lateral: 9.3  RIGHT VENTRICLE             IVC RV Basal diam:  2.90 cm     IVC diam: 2.20 cm RV S prime:     13.90 cm/s TAPSE (M-mode): 2.2 cm LEFT ATRIUM             Index        RIGHT ATRIUM           Index LA diam:        2.90 cm 1.85 cm/m   RA Area:     10.40 cm LA Vol (A2C):   36.4 ml 23.28 ml/m  RA Volume:   22.20 ml  14.20 ml/m LA Vol (A4C):   38.6 ml 24.69 ml/m LA Biplane Vol: 40.9 ml 26.16 ml/m  AORTIC VALVE LVOT Vmax:   101.00 cm/s LVOT Vmean:  63.600 cm/s LVOT VTI:    0.209 m MITRAL VALVE                TRICUSPID VALVE MV Area (PHT): 2.93 cm     TR Peak grad:   18.3 mmHg MV Decel Time: 259 msec     TR Vmax:        214.00 cm/s MV E velocity: 100.00 cm/s MV A velocity: 84.60 cm/s   SHUNTS MV E/A ratio:  1.18         Systemic VTI: 0.21 m Dorothye Gathers MD Electronically signed by Dorothye Gathers MD Signature Date/Time: 02/28/2024/4:43:14 PM    Final    CT Angio Chest/Abd/Pel for Dissection W and/or W/WO Result Date: 02/27/2024 CLINICAL DATA:  Acute onset chest, back, and stomach pain EXAM: CT ANGIOGRAPHY CHEST, ABDOMEN AND PELVIS TECHNIQUE: Non-contrast CT of the chest was initially obtained. Multidetector CT imaging through the chest, abdomen and pelvis was  performed using the standard protocol during bolus administration of intravenous contrast. Multiplanar reconstructed images and MIPs were obtained and reviewed to evaluate the vascular anatomy. RADIATION DOSE REDUCTION: This exam was performed according to the departmental dose-optimization program which includes automated exposure control, adjustment of the mA and/or kV according to patient size and/or use of iterative reconstruction technique. CONTRAST:  OMNIPAQUE IOHEXOL 350 MG/ML SOLN COMPARISON:  Same day chest radiograph, CT abdomen and pelvis dated 07/01/2010 FINDINGS: CTA CHEST FINDINGS Cardiovascular: Preferential opacification of the thoracic aorta. Nongated examination with motion artifact involving the aortic root and ascending aorta. No evidence of thoracic  aortic aneurysm or dissection. Normal heart size. No pericardial effusion. No central pulmonary emboli. Coronary artery calcifications. Mediastinum/Nodes: Imaged thyroid gland without nodules meeting criteria for imaging follow-up by size. Normal esophagus. No pathologically enlarged axillary, supraclavicular, mediastinal, or hilar lymph nodes. Lungs/Pleura: The central airways are patent. Upper lung predominant moderate paraseptal and centrilobular emphysema. Innumerable punctate and more tubular calcified granulomata in the right middle lobe. No pneumothorax. No pleural effusion. Musculoskeletal: No acute or abnormal lytic or blastic osseous lesions. Review of the MIP images confirms the above findings. CTA ABDOMEN AND PELVIS FINDINGS VASCULAR Dissection flap involving the proximal to mid splenic artery (5:101) with irregularity and mild dilation of the downstream splenic artery (5:99). Left gastric artery arises directly from the aorta. Aortic atherosclerosis. Mild luminal narrowing of the proximal superior mesenteric artery (5:107) and segmental complete occlusion spanning 1.7 cm (10:102, 8:79) With distal reconstitution and poststenotic  dilation measuring 7 mm (5:131). Graph Segmental mild-to-moderate narrowing of the bilateral common, external iliac, and proximal internal iliac arteries due to atherosclerotic plaque. Veins: No obvious venous abnormality within the limitations of this arterial phase study. Review of the MIP images confirms the above findings. NON-VASCULAR Hepatobiliary: Subcentimeter focus of arterial enhancement within segment 6 (5:119), too small to characterize but likely a flash filling hemangioma or perfusional variation. No intra or extrahepatic biliary ductal dilation. Normal gallbladder. Pancreas: No focal lesions or main ductal dilation. Spleen: Expected heterogeneous arterial enhancement of the spleen anteriorly. There is a relatively sharp demarcation with homogeneous hypoenhancement of the posteromedial spleen (5:92). Adrenals/Urinary Tract: No adrenal nodules. No suspicious renal mass, calculi or hydronephrosis. No focal bladder wall thickening. Stomach/Bowel: Normal appearance of the stomach. No evidence of bowel wall thickening, distention, or inflammatory changes. Appendix is not discretely seen. Lymphatic: No enlarged abdominal or pelvic lymph nodes. Reproductive: No adnexal masses. Other: No free fluid, fluid collection, or free air. Musculoskeletal: No acute or abnormal lytic or blastic osseous lesions. Review of the MIP images confirms the above findings. IMPRESSION: 1. Dissection flap involving the proximal to mid splenic artery with irregularity and mild dilation of the downstream splenic artery. 2. Sharply demarcated, homogeneous hypoenhancement of the posteromedial spleen, suspicious for splenic infarction. 3. 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 4. No evidence of thoracic aortic aneurysm or dissection. 5. Aortic Atherosclerosis (ICD10-I70.0) and Emphysema (ICD10-J43.9). Coronary artery calcifications. Assessment  for potential risk factor modification, dietary therapy or pharmacologic therapy may be warranted, if clinically indicated. These results will be called to the ordering clinician or representative by the Radiologist Assistant, and communication documented in the PACS or [employer]. Electronically Signed   By: Limin  Xu M.D.   On: 02/27/2024 14:21   DG Chest 2 View Result Date: 02/27/2024 CLINICAL DATA:  Abdominal pain and history of stomach ulcer EXAM: CHEST - 2 VIEW COMPARISON:  Chest radiograph dated 01/08/2017 FINDINGS: Normal lung volumes. Unchanged innumerable nodular densities throughout both lungs, which may reflect sequela of prior granulomatous infection. No new focal consolidations. No pleural effusion or pneumothorax. The heart size and mediastinal contours are within normal limits. No acute osseous abnormality. IMPRESSION: No active cardiopulmonary disease. Electronically Signed   By: Limin  Xu M.D.   On: 02/27/2024 10:10      Signed: Enos Harts, Medical Student  02/29/2024, 7:11 PM    Attestation for Student Documentation:  I personally was present and re-performed the history, physical exam and medical decision-making activities of this service and  have verified that the service and findings are accurately documented in the student's note.  Cleven Dallas, DO 02/29/2024, 7:11 PM

## 2024-02-29 NOTE — Plan of Care (Signed)

## 2024-02-29 NOTE — Progress Notes (Signed)
 [facility]  Subjective  -having more anxiety   Objective 119/77 68 98.6 F (37 C) (Oral) 19 98%  Intake/Output Summary (Last 24 hours) at 02/29/2024 0904 Last data filed at 02/28/2024 1938 Gross per 24 hour  Intake 209.55 ml  Output --  Net 209.55 ml    No significant abdominal pain  Laboratory Lab Results: Recent Labs    02/28/24 0642 02/28/24 1811  WBC 6.0 6.6  HGB 15.5* 14.5  HCT 45.2 41.7  PLT 127* 129*   BMET Recent Labs    02/27/24 0902 02/27/24 0920 02/28/24 0642  NA 137 136 138  K 3.9 3.7 4.2  CL 101 100 101  CO2 25  --  27  GLUCOSE 96 96 107*  BUN 9 10 13   CREATININE 0.67 0.60 0.66  CALCIUM 9.3  --  9.7    COAG No results found for: "INR", "PROTIME" No results found for: "PTT"  Assessment/Planning:  53 year old female admitted with splenic artery dissection with splenic infarct.  Abdominal pain overall much better.  Repeat CTA ordered this morning.  Will review images and could discharge later today.  Hemoglobin stable and tolerating heparin so far.  I believe her SMA segmental occlusion is chronic as this is well collateralized and she has no significant postprandial abdominal pain or weight loss.  Young Hensen 02/29/2024 9:04 AM --

## 2024-02-29 NOTE — TOC CM/SW Note (Signed)
 Asked to arrange a PCP for patient.   First available is located in [place] . Information on AVS. Discussed with patient and Dr Hayes Lipps. He will arrange PCP with his [facility] in [place]. Patient aware and prefers [place]. Patient will cancel [place] appointment

## 2024-02-29 NOTE — Progress Notes (Deleted)
 RN entered patients room and the patient's bed was highest position possible.  RN advised that bed would need to be lowered, patient advised that no on else has ever made him lower the bed.  Patient stated that he can not get out of the bed when it is lowered and that it hurts his hand too much to keep pressing the buttons to raise and lower the bed.  RN advised patient that he could call RN for assistance and I could lower the bed for him.  Patient advised that he will not call for someone to lower his bed every time that he needs to get out of bed.    Patient requested to speak with charge nurse - Charge nurse Obadiah Bellow) was notified of patient's request to speak with her

## 2024-03-01 ENCOUNTER — Other Ambulatory Visit (HOSPITAL_COMMUNITY): Payer: Self-pay

## 2024-03-05 ENCOUNTER — Ambulatory Visit: Admitting: Student

## 2024-03-05 VITALS — BP 110/60 | HR 65 | Temp 98.8°F | Ht 65.0 in | Wt 114.1 lb

## 2024-03-05 DIAGNOSIS — K921 Melena: Secondary | ICD-10-CM

## 2024-03-05 DIAGNOSIS — F17210 Nicotine dependence, cigarettes, uncomplicated: Secondary | ICD-10-CM | POA: Diagnosis not present

## 2024-03-05 DIAGNOSIS — Z1159 Encounter for screening for other viral diseases: Secondary | ICD-10-CM | POA: Insufficient documentation

## 2024-03-05 DIAGNOSIS — Z1211 Encounter for screening for malignant neoplasm of colon: Secondary | ICD-10-CM | POA: Insufficient documentation

## 2024-03-05 DIAGNOSIS — I7779 Dissection of other artery: Secondary | ICD-10-CM

## 2024-03-05 DIAGNOSIS — E785 Hyperlipidemia, unspecified: Secondary | ICD-10-CM

## 2024-03-05 DIAGNOSIS — Z0000 Encounter for general adult medical examination without abnormal findings: Secondary | ICD-10-CM | POA: Insufficient documentation

## 2024-03-05 DIAGNOSIS — D696 Thrombocytopenia, unspecified: Secondary | ICD-10-CM

## 2024-03-05 DIAGNOSIS — Z1239 Encounter for other screening for malignant neoplasm of breast: Secondary | ICD-10-CM | POA: Insufficient documentation

## 2024-03-05 DIAGNOSIS — R918 Other nonspecific abnormal finding of lung field: Secondary | ICD-10-CM

## 2024-03-05 DIAGNOSIS — F17200 Nicotine dependence, unspecified, uncomplicated: Secondary | ICD-10-CM

## 2024-03-05 NOTE — Progress Notes (Signed)
 CC: HFU, establish care  HPI:  [name] Diane Holmes is a 53 y.o. female with past medical history as detailed below who presents for HFU and to establish care. Please see problem based charting for detailed assessment and plan.  Past Medical History: Past Medical History:  Diagnosis Date   Ulcer    Past Surgical History: Past Surgical History:  Procedure Laterality Date   TUBAL LIGATION     Medications: Current Outpatient Medications  Medication Instructions   acetaminophen  (TYLENOL ) 1,000 mg, Oral, Every 8 hours   apixaban  (ELIQUIS ) 5 mg, Oral, 2 times daily   ondansetron  (ZOFRAN ) 4-8 mg, Oral, Every 8 hours PRN   oxyCODONE  (OXY IR/ROXICODONE ) 5 mg, Oral, Every 4 hours PRN   pantoprazole  (PROTONIX ) 40 mg, Oral, 2 times daily   simvastatin  (ZOCOR ) 10 mg, Oral, Every evening   Allergies: Allergies  Allergen Reactions   Penicillins Hives    Reported as childhood allergy, told by parent that she got hives and had trouble breathing   Social History: Social History   Socioeconomic History   Marital status: Single    Spouse name: Not on file   Number of children: Not on file   Years of education: Not on file   Highest education level: 12th grade  Occupational History   Not on file  Tobacco Use   Smoking status: Every Day    Current packs/day: 1.00    Average packs/day: 1 pack/day for 39.0 years (39.0 ttl pk-yrs)    Types: Cigarettes    Start date: 03/05/1985   Smokeless tobacco: Never  Substance and Sexual Activity   Alcohol use: Yes    Comment: occasionally   Drug use: No   Sexual activity: Yes    Birth control/protection: Surgical  Other Topics Concern   Not on file  Social History Narrative   Not on file   Social Drivers of Health   Financial Resource Strain: High Risk (03/05/2024)   Overall Financial Resource Strain (CARDIA)    Difficulty of Paying Living Expenses: Hard  Food Insecurity: Food Insecurity Present (03/05/2024)   Hunger Vital Sign     Worried About Running Out of Food in the Last Year: Often true    Ran Out of Food in the Last Year: Sometimes true  Transportation Needs: No Transportation Needs (03/05/2024)   PRAPARE - Administrator, Civil Service (Medical): No    Lack of Transportation (Non-Medical): No  Physical Activity: Sufficiently Active (03/05/2024)   Exercise Vital Sign    Days of Exercise per Week: 5 days    Minutes of Exercise per Session: 140 min  Stress: Stress Concern Present (03/05/2024)   [employer] of Occupational Health - Occupational Stress Questionnaire    Feeling of Stress : To some extent  Social Connections: Moderately Isolated (03/05/2024)   Social Connection and Isolation Panel [NHANES]    Frequency of Communication with Friends and Family: More than three times a week    Frequency of Social Gatherings with Friends and Family: Twice a week    Attends Religious Services: Never    Database administrator or Organizations: No    Attends Engineer, structural: Not on file    Marital Status: Living with partner  Intimate Partner Violence: Not At Risk (02/27/2024)   Humiliation, Afraid, Rape, and Kick questionnaire    Fear of Current or Ex-Partner: No    Emotionally Abused: No    Physically Abused: No    Sexually Abused: No  Family History: Family History  Problem Relation Age of Onset   Stroke Mother    COPD Mother    Stroke Father    Parkinson's disease Father    Breast cancer Maternal Grandmother    Stroke Maternal Grandmother    Diabetes Paternal Uncle    Review of Systems:  Negative unless otherwise stated.  Physical Exam:  Vitals:   03/05/24 1538  BP: 110/60  Pulse: 65  Temp: 98.8 F (37.1 C)  TempSrc: Oral  SpO2: 100%  Weight: 114 lb 1.6 oz (51.8 kg)  Height: 5\' 5"  (1.651 m)   Constitutional:Appears stated age, well. In no acute distress. Cardio:Regular rate and rhythm. No murmurs, rubs, or gallops. Pulm:Clear to auscultation bilaterally. Normal  work of breathing on room air. Abdomen:Soft, nontender, nondistended. ZOX:[id number] for extremity edema. Skin:Warm and dry. Neuro:Alert and oriented x3. No focal deficit noted. Psych:Pleasant mood and affect.  Assessment & Plan:   See Encounters Tab for problem based charting.  Dissection of splenic artery [facility]) Patient recently hospitalized with atherosclerotic splenic artery dissection versus plaque occlusion which caused splenic infarction. CTA following heparin  initiation showed that thrombus was more likely chronic and that the dissection flap noted at time of admission was likely a mixed density clot. ASCVD risk was calculated to be 3.6%, she was not started on a statin prior to discharge. She was discharged on eliquis  5 mg BID which she is compliant with. Overall doing well, pain sometimes requires a dose of oxycodone  at night due to severity but she is trying to manage with with tylenol  alone. Plan: F/u with [facility] 05/20. Start simvastatin  10 mg daily.  Melena Patient endorsed black, tarry stools during recent admission that had been occurring for about 3 months. Hgb was Diane throughout admission and no concerning stools were noted. She is due for colonoscopy. Last episode of melena was Saturday. Plan: Referral to GI placed. CBC today. Continue protonix  40 mg BID through 04/30, then daily.  Hyperlipidemia LDL 174 when checked 04/15. She was not started on statin therapy prior to discharge as ASCVD risk was 3.6% however given splenic artery atherosclerosis, and in conversation with the patient, the decision was made to start a statin now. Plan: Start simvastatin  10 mg daily.  Tobacco use disorder Patient refused nicotine  patches prior to discharge and at this time is not interested in pharmacological assistance in smoking cessation.   Pulmonary nodules During recent admission, CTA chest showed moderate emphysematous changes in the upper lobes with multiple pulmonary nodules and  scattered calcified granulomas.  Plan: Recommend follow up CT chest in 12 months. Non-con CT chest recommended in 12 months.  Encounter for screening for malignant neoplasm of breast Plan: Order placed for mammogram.  Encounter for hepatitis C screening test for low risk patient Plan: HCV screening completed today.  Healthcare maintenance Please address pap smear and getting up to date on vaccines at next OV.  Patient discussed with Dr. Ancil Balzarine

## 2024-03-05 NOTE — Patient Instructions (Addendum)
 Ms. Curless,  It was a pleasure to care for you today!  I have started a medicine for your cholesterol called simvastatin . You will take one 10 mg tablet daily. Otherwise I am not making any changes to your medications.  I would like you to follow up in about 2 months. At that time we will make sure your pap smear is done and discuss making sure your vaccines are up to date. We will make sure your referrals and studies have been scheduled/coordinated.  I will let you know what your labs show and if we need to change your care plan as a result of them.  My best, Dr. Rozelle Corning

## 2024-03-05 NOTE — Progress Notes (Signed)
 Taking oxy only for severe pain, takes one a day at most Low dose statin Has still had dark stools, last one was Saturday and it was painful

## 2024-03-06 ENCOUNTER — Ambulatory Visit: Admitting: Family Medicine

## 2024-03-06 ENCOUNTER — Encounter: Payer: Self-pay | Admitting: Internal Medicine

## 2024-03-06 ENCOUNTER — Other Ambulatory Visit: Payer: Self-pay | Admitting: Student

## 2024-03-06 DIAGNOSIS — R918 Other nonspecific abnormal finding of lung field: Secondary | ICD-10-CM | POA: Insufficient documentation

## 2024-03-06 LAB — CBC
Hematocrit: 41.8 % (ref 34.0–46.6)
Hemoglobin: 14 g/dL (ref 11.1–15.9)
MCH: 30.7 pg (ref 26.6–33.0)
MCHC: 33.5 g/dL (ref 31.5–35.7)
MCV: 92 fL (ref 79–97)
Platelets: 139 10*3/uL — ABNORMAL LOW (ref 150–450)
RBC: 4.56 x10E6/uL (ref 3.77–5.28)
RDW: 11.8 % (ref 11.7–15.4)
WBC: 6.8 10*3/uL (ref 3.4–10.8)

## 2024-03-06 LAB — HCV INTERPRETATION

## 2024-03-06 LAB — HCV AB W REFLEX TO QUANT PCR: HCV Ab: NONREACTIVE

## 2024-03-06 MED ORDER — SIMVASTATIN 10 MG PO TABS: 10.0000 mg | ORAL_TABLET | Freq: Every evening | ORAL | 11 refills | Status: DC

## 2024-03-06 NOTE — Assessment & Plan Note (Signed)
 LDL 174 when checked 04/15. She was not started on statin therapy prior to discharge as ASCVD risk was 3.6% however given splenic artery atherosclerosis, and in conversation with the patient, the decision was made to start a statin now. Plan: Start simvastatin  10 mg daily.

## 2024-03-06 NOTE — Assessment & Plan Note (Signed)
 Patient endorsed black, tarry stools during recent admission that had been occurring for about 3 months. Hgb was stable throughout admission and no concerning stools were noted. She is due for colonoscopy. Last episode of melena was Saturday. Plan: Referral to GI placed. CBC today. Continue protonix  40 mg BID through 04/30, then daily.

## 2024-03-06 NOTE — Assessment & Plan Note (Signed)
 Plan: Order placed for mammogram.

## 2024-03-06 NOTE — Assessment & Plan Note (Signed)
 Please address pap smear and getting up to date on vaccines at next OV.

## 2024-03-06 NOTE — Assessment & Plan Note (Signed)
 Patient refused nicotine  patches prior to discharge and at this time is not interested in pharmacological assistance in smoking cessation.

## 2024-03-06 NOTE — Assessment & Plan Note (Signed)
 Plan: HCV screening completed today.

## 2024-03-06 NOTE — Assessment & Plan Note (Signed)
 During recent admission, CTA chest showed moderate emphysematous changes in the upper lobes with multiple pulmonary nodules and scattered calcified granulomas.  Plan: Recommend follow up CT chest in 12 months. Non-con CT chest recommended in 12 months.

## 2024-03-06 NOTE — Assessment & Plan Note (Signed)
 Patient recently hospitalized with atherosclerotic splenic artery dissection versus plaque occlusion which caused splenic infarction. CTA following heparin  initiation showed that thrombus was more likely chronic and that the dissection flap noted at time of admission was likely a mixed density clot. ASCVD risk was calculated to be 3.6%, Diane Holmes was not started on a statin prior to discharge. Diane Holmes was discharged on eliquis  5 mg BID which Diane Holmes is compliant with. Overall doing well, pain sometimes requires a dose of oxycodone  at night due to severity but Diane Holmes is trying to manage with with tylenol  alone. Plan: F/u with [facility] 05/20. Start simvastatin  10 mg daily.

## 2024-03-07 ENCOUNTER — Other Ambulatory Visit: Payer: Self-pay

## 2024-03-07 DIAGNOSIS — I7779 Dissection of other artery: Secondary | ICD-10-CM

## 2024-03-07 NOTE — Telephone Encounter (Signed)
 Last rx written - 02/29/24. Last OV - 02/29/24. Next OV - 05/07/24 with PCP.

## 2024-03-08 ENCOUNTER — Encounter: Payer: Self-pay | Admitting: Internal Medicine

## 2024-03-09 MED ORDER — OXYCODONE HCL 5 MG PO TABS: 5.0000 mg | ORAL_TABLET | Freq: Two times a day (BID) | ORAL | 0 refills | Status: DC | PRN

## 2024-03-12 ENCOUNTER — Telehealth: Payer: Self-pay

## 2024-03-12 NOTE — Telephone Encounter (Signed)
 I called the patient to let her know that her FMLA paperwork has been completed. We faxed the paperwork to her employer and we will mail her the paperwork. A copy will be scanned into the patients chart.

## 2024-03-12 NOTE — Progress Notes (Signed)
 Internal Medicine Clinic Attending  Case discussed with the resident at the time of the visit.  We reviewed the resident's history and exam and pertinent patient test results.  I agree with the assessment, diagnosis, and plan of care documented in the resident's note.

## 2024-03-16 ENCOUNTER — Ambulatory Visit
Admission: RE | Admit: 2024-03-16 | Discharge: 2024-03-16 | Disposition: A | Discharge status: Home / Self Care | Source: Ambulatory Visit | Attending: Vascular Surgery | Admitting: Vascular Surgery

## 2024-03-16 DIAGNOSIS — I7779 Dissection of other artery: Secondary | ICD-10-CM

## 2024-03-16 DIAGNOSIS — I700 Atherosclerosis of aorta: Secondary | ICD-10-CM | POA: Diagnosis not present

## 2024-03-16 MED ORDER — IOPAMIDOL (ISOVUE-370) INJECTION 76%
75.0000 mL | Freq: Once | INTRAVENOUS | Status: AC | PRN
  Administered 2024-03-16: 75 mL via INTRAVENOUS

## 2024-04-03 ENCOUNTER — Encounter: Payer: Self-pay | Admitting: Vascular Surgery

## 2024-04-03 ENCOUNTER — Ambulatory Visit: Attending: Vascular Surgery | Admitting: Vascular Surgery

## 2024-04-03 VITALS — BP 106/69 | HR 84 | Temp 98.6°F | Resp 16 | Ht 65.0 in | Wt 111.7 lb

## 2024-04-03 DIAGNOSIS — I7779 Dissection of other artery: Secondary | ICD-10-CM | POA: Diagnosis not present

## 2024-04-03 MED ORDER — APIXABAN 5 MG PO TABS: 5.0000 mg | ORAL_TABLET | Freq: Two times a day (BID) | ORAL | 0 refills | Status: AC

## 2024-04-03 NOTE — Progress Notes (Signed)
 Patient name: Diane Holmes MRN: [id number] DOB: 1971/11/02 Sex: female  REASON FOR CONSULT: Hospital follow-up for splenic artery dissection  HPI: Diane Holmes is a 53 y.o. female, that presents for 1 month follow-up with repeat CT scan for splenic artery dissection.  Previously seen in consultation on 02/27/2024 with acute left-sided abdominal pain with CT evidence of splenic artery dissection with some infarction in the spleen.  She has been tolerating Eliquis  now for a month.  No new complaints today.  No significant abdominal pain.  She did have evidence of an SMA distal segmental occlusion with distal reconstitution on CT that we felt was chronic and unrelated.  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     Ulcer     Past Surgical History:  Procedure Laterality Date   TUBAL LIGATION      Family History  Problem Relation Age of Onset   Stroke Mother    COPD Mother    Stroke Father    Parkinson's disease Father    Breast cancer Maternal Grandmother    Stroke Maternal Grandmother    Diabetes Paternal Uncle     SOCIAL HISTORY: Social History   Socioeconomic History   Marital status: Single    Spouse name: Not on file   Number of children: Not on file   Years of education: Not on file   Highest education level: 12th grade  Occupational History   Not on file  Tobacco Use   Smoking status: Every Day    Current packs/day: 1.00    Average packs/day: 1 pack/day for 39.1 years (39.1 ttl pk-yrs)    Types: Cigarettes    Start date: 03/05/1985   Smokeless tobacco: Never  Vaping Use   Vaping status: Never Used  Substance and Sexual Activity   Alcohol use: Yes    Comment: occasionally   Drug use: No   Sexual activity: Yes    Birth control/protection: Surgical  Other Topics Concern   Not on file  Social History Narrative   Not on file   Social Drivers of Health   Financial Resource Strain: High Risk  (03/05/2024)   Overall Financial Resource Strain (CARDIA)    Difficulty of Paying Living Expenses: Hard  Food Insecurity: Food Insecurity Present (03/05/2024)   Hunger Vital Sign    Worried About Running Out of Food in the Last Year: Often true    Ran Out of Food in the Last Year: Sometimes true  Transportation Needs: No Transportation Needs (03/05/2024)   PRAPARE - Administrator, Civil Service (Medical): No    Lack of Transportation (Non-Medical): No  Physical Activity: Sufficiently Active (03/05/2024)   Exercise Vital Sign    Days of Exercise per Week: 5 days    Minutes of Exercise per Session: 140 min  Stress: Stress Concern Present (03/05/2024)   [employer] of Occupational Health - Occupational Stress Questionnaire    Feeling of Stress : To some extent  Social Connections: Moderately Isolated (03/05/2024)   Social Connection and Isolation Panel [NHANES]    Frequency of Communication with Friends and Family: More than three times a week    Frequency of Social Gatherings with Friends and Family: Twice a week    Attends Religious Services: Never    Database administrator or Organizations: No    Attends Engineer, structural: Not on file    Marital Status: Living with partner  Intimate  Partner Violence: Not At Risk (02/27/2024)   Humiliation, Afraid, Rape, and Kick questionnaire    Fear of Current or Ex-Partner: No    Emotionally Abused: No    Physically Abused: No    Sexually Abused: No    Allergies  Allergen Reactions   Penicillins Hives    Reported as childhood allergy, told by parent that she got hives and had trouble breathing    Current Outpatient Medications  Medication Sig Dispense Refill   acetaminophen  (TYLENOL ) 500 MG tablet Take 2 tablets (1,000 mg total) by mouth every 8 (eight) hours. 30 tablet 0   apixaban  (ELIQUIS ) 5 MG TABS tablet Take 1 tablet (5 mg total) by mouth 2 (two) times daily. 60 tablet 0   ondansetron  (ZOFRAN ) 4 MG tablet  Take 1-2 tablets (4-8 mg total) by mouth every 8 (eight) hours as needed for nausea or vomiting. (Patient not taking: Reported on 04/03/2024) 30 tablet 0   oxyCODONE  (OXY IR/ROXICODONE ) 5 MG immediate release tablet Take 1 tablet (5 mg total) by mouth every 12 (twelve) hours as needed for severe pain (pain score 7-10). (Patient not taking: Reported on 04/03/2024) 6 tablet 0   pantoprazole  (PROTONIX ) 40 MG tablet Take 1 tablet (40 mg total) by mouth 2 (two) times daily for 14 days. 28 tablet 0   simvastatin  (ZOCOR ) 10 MG tablet Take 1 tablet (10 mg total) by mouth every evening. (Patient not taking: Reported on 04/03/2024) 30 tablet 11   No current facility-administered medications for this visit.    REVIEW OF SYSTEMS:  [X]  denotes positive finding, [ ]  denotes negative finding Cardiac  Comments:  Chest pain or chest pressure:    Shortness of breath upon exertion:    Short of breath when lying flat:    Irregular heart rhythm:        Vascular    Pain in calf, thigh, or hip brought on by ambulation:    Pain in feet at night that wakes you up from your sleep:     Blood clot in your veins:    Leg swelling:         Pulmonary    Oxygen at home:    Productive cough:     Wheezing:         Neurologic    Sudden weakness in arms or legs:     Sudden numbness in arms or legs:     Sudden onset of difficulty speaking or slurred speech:    Temporary loss of vision in one eye:     Problems with dizziness:         Gastrointestinal    Blood in stool:     Vomited blood:         Genitourinary    Burning when urinating:     Blood in urine:        Psychiatric    Major depression:         Hematologic    Bleeding problems:    Problems with blood clotting too easily:        Skin    Rashes or ulcers:        Constitutional    Fever or chills:      PHYSICAL EXAM: Vitals:   04/03/24 1603  BP: 106/69  Pulse: 84  Resp: 16  Temp: 98.6 F (37 C)  TempSrc: Temporal  SpO2: 99%  Weight: 111  lb 11.2 oz (50.7 kg)  Height: 5\' 5"  (1.651 m)    GENERAL: The  patient is a well-nourished female, in no acute distress. The vital signs are documented above. CARDIAC: There is a regular rate and rhythm.  VASCULAR:  Palpable femoral pulses PULMONARY: No respiratory distress ABDOMEN: Soft and non-tender.  No rebound or guarding. MUSCULOSKELETAL: There are no major deformities or cyanosis. NEUROLOGIC: No focal weakness or paresthesias are detected. SKIN: There are no ulcers or rashes noted. PSYCHIATRIC: The patient has a normal affect.  DATA:   CTA reviewed 03/16/24 with stable appearance of splenic artery dissection and stable segmental occlusion of the distal SMA with reconstitution  Assessment/Plan:  53 y.o. female, that presents for 1 month follow-up with repeat CT scan for splenic artery dissection.  Previously seen in consultation on 02/27/2024 with acute left-sided abdominal pain with CT evidence of splenic artery dissection with some infarction in the spleen.  She has been tolerating Eliquis  now for a month.  No new complaints today.  No significant abdominal pain.  Discussed her repeat CT scan is stable.  I think after 3 months she can stop her Eliquis  and she can transition to 81 mg aspirin.  I do not think she needs chronic anticoagulation therapy beyond 3 months.  There was concern that there was some acute thrombus with her initial presentation as it was associated with her splenic dissection with splenic infarction.  I will see her in 6 months with repeat CTA.  Will evaluate for aneurysmal degeneration over time.  Echocardiogram reassuring during her hospitalization.  Really unclear etiology for her dissection and discussed smoking cessation.  Incidental distal SMA occlusion with reconstitution felt to be chronic and unrelated.  No food fear etc. at this time.   Young Hensen, MD [facility] Office: [phone]

## 2024-05-07 ENCOUNTER — Ambulatory Visit (INDEPENDENT_AMBULATORY_CARE_PROVIDER_SITE_OTHER): Admitting: Student

## 2024-05-07 ENCOUNTER — Telehealth: Payer: Self-pay | Admitting: Student

## 2024-05-07 VITALS — BP 104/65 | HR 66 | Temp 98.4°F | Ht 65.0 in | Wt 113.1 lb

## 2024-05-07 DIAGNOSIS — E785 Hyperlipidemia, unspecified: Secondary | ICD-10-CM

## 2024-05-07 DIAGNOSIS — Z0000 Encounter for general adult medical examination without abnormal findings: Secondary | ICD-10-CM

## 2024-05-07 DIAGNOSIS — D696 Thrombocytopenia, unspecified: Secondary | ICD-10-CM

## 2024-05-07 DIAGNOSIS — F17200 Nicotine dependence, unspecified, uncomplicated: Secondary | ICD-10-CM

## 2024-05-07 DIAGNOSIS — K921 Melena: Secondary | ICD-10-CM | POA: Diagnosis not present

## 2024-05-07 DIAGNOSIS — I7779 Dissection of other artery: Secondary | ICD-10-CM | POA: Diagnosis not present

## 2024-05-07 DIAGNOSIS — K55069 Acute infarction of intestine, part and extent unspecified: Secondary | ICD-10-CM | POA: Diagnosis not present

## 2024-05-07 DIAGNOSIS — F17210 Nicotine dependence, cigarettes, uncomplicated: Secondary | ICD-10-CM

## 2024-05-07 NOTE — Assessment & Plan Note (Signed)
 Had follow-up with vascular surgery on 04/03/2024 with plans to continue anticoagulation for 3 months after recent acute on chronic splenic artery occlusion and then continue with aspirin 81 mg daily alone.  Also planned to have repeat CTA in about 6 months.  Patient understands this plan.

## 2024-05-07 NOTE — Patient Instructions (Addendum)
 Thank you, [name], for allowing us  to provide your care today. Today we discussed . . .  > Low platelets       - We are rechecking your CBC today and if there is any worsening of your low platelets I will call you to talk about our next steps. > High cholesterol       - After starting the simvastatin  in April we will recheck your lipid panel today and if we need to increase this medication or add another medication I will call you to discuss this. > Dark bowel movements       - I have provided the information for the gastroenterology office below.  Please give them a call to schedule your appointment for further workup of your dark bowel movements.  [facility] Located in: [facility] [address] Address: [address] [place] [address], [place], [place] [address] Phone: [phone]  If it anytime you are ready to try nicotine  patches or a medication like Chantix please give us  a call and we can send 1 of these to the pharmacy.  If you would like to start nicotine  patches I would start with the 21 mg patch and then decrease to 14 mg after 3 to 4 weeks of the 21 mg patch.  If you are able to find any information about your last Pap smear please let us  know and bring it to your next visit.  If you would prefer to get your Pap smear done at the OB/GYN clinic let us  know otherwise we will plan to perform a Pap smear at your next visit.   I have ordered the following labs for you:   Lab Orders         CBC with Diff         Lipid Profile       Referrals ordered today:   Referral Orders  No referral(s) requested today      Follow up: 3 months    Remember:  Should you have any questions or concerns please call the internal medicine clinic at [phone].     Fairy Pool, DO [facility]

## 2024-05-07 NOTE — Assessment & Plan Note (Signed)
 Continues to smoke 0.5-1 packs/day but is thinking more more about quitting.  Discussed Chantix and nicotine  patches.  She is not ready to try either these or stop smoking yet but knows that she can call our clinic at any time to start Chantix or nicotine  patches.

## 2024-05-07 NOTE — Assessment & Plan Note (Signed)
 Discussed Pap smear today.  She thinks that she had 1 in the last 5 years with [facility].  We do not have access to these results and she will bring any papers that she has at home to her next visit.  If she is unable to bring proof of a Pap smear in the last 5 years we will repeat Pap smear at her next visit.  Also discussed pneumococcal vaccine along with shingles and COVID vaccines which she will get at the pharmacy but she is worried about having side effects and would like to get them before weekend instead of at the beginning of the week.

## 2024-05-07 NOTE — Assessment & Plan Note (Signed)
 Started on simvastatin  10 mg daily about 2 months ago.  Repeat lipid panel today.

## 2024-05-07 NOTE — Assessment & Plan Note (Signed)
 Platelet levels recently roughly stable at 120,000.  Still having some melenic stools but these are improving.  Continues on anticoagulation with Eliquis  which will change to aspirin 81 mg daily next month. - CBC today

## 2024-05-07 NOTE — Assessment & Plan Note (Signed)
 Symptoms slowly improving with less melena recently.  Hemoglobin has remained stable and within normal limits with last CBC checked on 03/05/2024.  Rechecking CBC today while watching platelet levels.  She has not been able to make an appointment with GI so I have given her their information today. - Follow-up with GI

## 2024-05-07 NOTE — Progress Notes (Signed)
 CC: Routine Follow Up for thrombocytopenia, tobacco use disorder, and health maintenance after last office visit 03/05/2024  HPI:  Diane Holmes is a 53 y.o. female with pertinent PMH of atherosclerotic disease with splenic artery occlusion, hyperlipidemia, tobacco use disorder, and pulmonary nodules who presents as above. Please see assessment and plan below for further details.  Medications: Current Outpatient Medications  Medication Instructions   acetaminophen  (TYLENOL ) 1,000 mg, Oral, Every 8 hours   apixaban  (ELIQUIS ) 5 mg, Oral, 2 times daily   ondansetron  (ZOFRAN ) 4-8 mg, Oral, Every 8 hours PRN   oxyCODONE  (OXY IR/ROXICODONE ) 5 mg, Oral, Every 12 hours PRN   pantoprazole  (PROTONIX ) 40 mg, Oral, 2 times daily   simvastatin  (ZOCOR ) 10 mg, Oral, Every evening     Review of Systems:   Pertinent items noted in HPI and/or A&P.  Physical Exam:  Vitals:   05/07/24 1555  BP: 104/65  Pulse: 66  Temp: 98.4 F (36.9 C)  TempSrc: Oral  SpO2: 94%  Weight: 113 lb 1.6 oz (51.3 kg)  Height: 5' 5 (1.651 m)    Constitutional: Well-appearing adult female. In no acute distress. HEENT: Normocephalic, atraumatic, Sclera non-icteric, PERRL, EOM intact Cardio:Regular rate and rhythm. 2+ bilateral radial pulses. Pulm:Clear to auscultation bilaterally. Normal work of breathing on room air. Abdomen: Soft, non-tender, non-distended, positive bowel sounds. FDX:Wzhjupcz for extremity edema. Skin:Warm and dry. Neuro:Alert and oriented x3. No focal deficit noted. Psych:Pleasant mood and affect.   Assessment & Plan:   Plaque Occlusion of Splenic Artery Had follow-up with vascular surgery on 04/03/2024 with plans to continue anticoagulation for 3 months after recent acute on chronic splenic artery occlusion and then continue with aspirin 81 mg daily alone.  Also planned to have repeat CTA in about 6 months.  Patient understands this plan.  Melena Symptoms slowly improving with less  melena recently.  Hemoglobin has remained stable and within normal limits with last CBC checked on 03/05/2024.  Rechecking CBC today while watching platelet levels.  She has not been able to make an appointment with GI so I have given her their information today. - Follow-up with GI  Thrombocytopenia (HCC) Platelet levels recently roughly stable at 120,000.  Still having some melenic stools but these are improving.  Continues on anticoagulation with Eliquis  which will change to aspirin 81 mg daily next month. - CBC today  Hyperlipidemia Started on simvastatin  10 mg daily about 2 months ago.  Repeat lipid panel today.  Tobacco use disorder Continues to smoke 0.5-1 packs/day but is thinking more more about quitting.  Discussed Chantix and nicotine  patches.  She is not ready to try either these or stop smoking yet but knows that she can call our clinic at any time to start Chantix or nicotine  patches.  Healthcare maintenance Discussed Pap smear today.  She thinks that she had 1 in the last 5 years with [facility].  We do not have access to these results and she will bring any papers that she has at home to her next visit.  If she is unable to bring proof of a Pap smear in the last 5 years we will repeat Pap smear at her next visit.  Also discussed pneumococcal vaccine along with shingles and COVID vaccines which she will get at the pharmacy but she is worried about having side effects and would like to get them before weekend instead of at the beginning of the week.    Patient discussed with Dr. Mliss Lovie Pac  [facility] Internal Medicine Resident PGY-2 Clinic Phone: [phone] Please contact the on call pager at [phone] for any urgent or emergent needs.

## 2024-05-07 NOTE — Telephone Encounter (Signed)
 Rtn pt's call. LVM for patient to call back if she had any questions about her sch visit for toady @ 3:45 pm as this is a 2 month f/u appt.  Copied from CRM [id number]. Topic: Appointments - Appointment Info/Confirmation >> May 03, 2024  2:00 PM Carrielelia G wrote: Please call patient she is wanting to know the reason she is coming in for upcoming appt.   Please advise.

## 2024-05-08 ENCOUNTER — Ambulatory Visit: Payer: Self-pay | Admitting: Student

## 2024-05-08 DIAGNOSIS — E785 Hyperlipidemia, unspecified: Secondary | ICD-10-CM

## 2024-05-08 LAB — CBC WITH DIFFERENTIAL/PLATELET
Basophils Absolute: 0 10*3/uL (ref 0.0–0.2)
Basos: 0 %
EOS (ABSOLUTE): 0.1 10*3/uL (ref 0.0–0.4)
Eos: 1 %
Hematocrit: 39.4 % (ref 34.0–46.6)
Hemoglobin: 13.1 g/dL (ref 11.1–15.9)
Immature Grans (Abs): 0 10*3/uL (ref 0.0–0.1)
Immature Granulocytes: 0 %
Lymphocytes Absolute: 2 10*3/uL (ref 0.7–3.1)
Lymphs: 33 %
MCH: 30.9 pg (ref 26.6–33.0)
MCHC: 33.2 g/dL (ref 31.5–35.7)
MCV: 93 fL (ref 79–97)
Monocytes Absolute: 0.5 10*3/uL (ref 0.1–0.9)
Monocytes: 8 %
Neutrophils Absolute: 3.6 10*3/uL (ref 1.4–7.0)
Neutrophils: 58 %
Platelets: 136 10*3/uL — ABNORMAL LOW (ref 150–450)
RBC: 4.24 x10E6/uL (ref 3.77–5.28)
RDW: 11.9 % (ref 11.7–15.4)
WBC: 6.2 10*3/uL (ref 3.4–10.8)

## 2024-05-08 LAB — LIPID PANEL
Chol/HDL Ratio: 3.6 ratio (ref 0.0–4.4)
Cholesterol, Total: 213 mg/dL — ABNORMAL HIGH (ref 100–199)
HDL: 59 mg/dL (ref >39)
LDL Chol Calc (NIH): 137 mg/dL — ABNORMAL HIGH (ref 0–99)
Triglycerides: 96 mg/dL (ref 0–149)
VLDL Cholesterol Cal: 17 mg/dL (ref 5–40)

## 2024-05-08 MED ORDER — SIMVASTATIN 20 MG PO TABS: 20.0000 mg | ORAL_TABLET | Freq: Every evening | ORAL | 6 refills | Status: AC

## 2024-05-08 NOTE — Progress Notes (Signed)
 Addendum to call the patient but unable to reach her over the phone.  Lipid panel shows moderate improvement in LDL from 174-137 over the past 2 months after starting simvastatin  10 mg daily.  Plan to increase to 20 mg daily and repeat lipid panel in 3 months.

## 2024-05-09 NOTE — Progress Notes (Signed)
 Internal Medicine Clinic Attending  Case discussed with the resident at the time of the visit.  We reviewed the resident's history and exam and pertinent patient test results.  I agree with the assessment, diagnosis, and plan of care documented in the resident's note.

## 2024-08-22 ENCOUNTER — Other Ambulatory Visit: Payer: Self-pay

## 2024-08-22 DIAGNOSIS — I7779 Dissection of other artery: Secondary | ICD-10-CM

## 2024-09-27 ENCOUNTER — Ambulatory Visit
Admission: RE | Admit: 2024-09-27 | Discharge: 2024-09-27 | Disposition: A | Discharge status: Home / Self Care | Source: Ambulatory Visit | Attending: Vascular Surgery | Admitting: Vascular Surgery

## 2024-09-27 DIAGNOSIS — I7779 Dissection of other artery: Secondary | ICD-10-CM | POA: Diagnosis not present

## 2024-09-27 MED ORDER — IOPAMIDOL (ISOVUE-370) INJECTION 76%
75.0000 mL | Freq: Once | INTRAVENOUS | Status: AC | PRN
  Administered 2024-09-27: 75 mL via INTRAVENOUS

## 2024-10-02 ENCOUNTER — Ambulatory Visit: Admitting: Vascular Surgery

## 2024-10-08 NOTE — Progress Notes (Unsigned)
 Patient name: Diane Holmes MRN: [id number] DOB: 1971/07/13 Sex: female  REASON FOR CONSULT: 6 month follow-up for splenic artery dissection  HPI: Diane Holmes is a 53 y.o. female, that presents for 6 month follow-up with repeat CT scan for splenic artery dissection.  Previously seen in consultation on 02/27/2024 with acute left-sided abdominal pain with CT evidence of splenic artery dissection with some infarction in the spleen.  We recommended 3 months of anticoagulation due to some thrombus in the splenic artery with splenic infarct.  She did have evidence of an SMA distal segmental occlusion with distal reconstitution on CT that we felt was chronic and unrelated.  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     Ulcer     Past Surgical History:  Procedure Laterality Date   TUBAL LIGATION      Family History  Problem Relation Age of Onset   Stroke Mother    COPD Mother    Stroke Father    Parkinson's disease Father    Breast cancer Maternal Grandmother    Stroke Maternal Grandmother    Diabetes Paternal Uncle     SOCIAL HISTORY: Social History   Socioeconomic History   Marital status: Single    Spouse name: Not on file   Number of children: Not on file   Years of education: Not on file   Highest education level: 12th grade  Occupational History   Not on file  Tobacco Use   Smoking status: Every Day    Current packs/day: 1.00    Average packs/day: 1 pack/day for 39.6 years (39.6 ttl pk-yrs)    Types: Cigarettes    Start date: 03/05/1985   Smokeless tobacco: Never  Vaping Use   Vaping status: Never Used  Substance and Sexual Activity   Alcohol use: Yes    Comment: occasionally   Drug use: No   Sexual activity: Yes    Birth control/protection: Surgical  Other Topics Concern   Not on file  Social History Narrative   Not on file   Social Drivers of Health   Financial Resource Strain: High Risk  (03/05/2024)   Overall Financial Resource Strain (CARDIA)    Difficulty of Paying Living Expenses: Hard  Food Insecurity: Food Insecurity Present (03/05/2024)   Hunger Vital Sign    Worried About Running Out of Food in the Last Year: Often true    Ran Out of Food in the Last Year: Sometimes true  Transportation Needs: No Transportation Needs (03/05/2024)   PRAPARE - Administrator, Civil Service (Medical): No    Lack of Transportation (Non-Medical): No  Physical Activity: Sufficiently Active (03/05/2024)   Exercise Vital Sign    Days of Exercise per Week: 5 days    Minutes of Exercise per Session: 140 min  Stress: Stress Concern Present (03/05/2024)   [employer] of Occupational Health - Occupational Stress Questionnaire    Feeling of Stress : To some extent  Social Connections: Moderately Isolated (03/05/2024)   Social Connection and Isolation Panel    Frequency of Communication with Friends and Family: More than three times a week    Frequency of Social Gatherings with Friends and Family: Twice a week    Attends Religious Services: Never    Database Administrator or Organizations: No    Attends Engineer, Structural: Not on file    Marital Status: Living with partner  Intimate Partner Violence:  Not At Risk (02/27/2024)   Humiliation, Afraid, Rape, and Kick questionnaire    Fear of Current or Ex-Partner: No    Emotionally Abused: No    Physically Abused: No    Sexually Abused: No    Allergies  Allergen Reactions   Penicillins Hives    Reported as childhood allergy, told by parent that she got hives and had trouble breathing    Current Outpatient Medications  Medication Sig Dispense Refill   acetaminophen  (TYLENOL ) 500 MG tablet Take 2 tablets (1,000 mg total) by mouth every 8 (eight) hours. 30 tablet 0   apixaban  (ELIQUIS ) 5 MG TABS tablet Take 1 tablet (5 mg total) by mouth 2 (two) times daily. 60 tablet 0   ondansetron  (ZOFRAN ) 4 MG tablet Take 1-2  tablets (4-8 mg total) by mouth every 8 (eight) hours as needed for nausea or vomiting. (Patient not taking: Reported on 04/03/2024) 30 tablet 0   oxyCODONE  (OXY IR/ROXICODONE ) 5 MG immediate release tablet Take 1 tablet (5 mg total) by mouth every 12 (twelve) hours as needed for severe pain (pain score 7-10). (Patient not taking: Reported on 04/03/2024) 6 tablet 0   pantoprazole  (PROTONIX ) 40 MG tablet Take 1 tablet (40 mg total) by mouth 2 (two) times daily for 14 days. 28 tablet 0   simvastatin  (ZOCOR ) 20 MG tablet Take 1 tablet (20 mg total) by mouth every evening. 30 tablet 6   No current facility-administered medications for this visit.    REVIEW OF SYSTEMS:  [X]  denotes positive finding, [ ]  denotes negative finding Cardiac  Comments:  Chest pain or chest pressure:    Shortness of breath upon exertion:    Short of breath when lying flat:    Irregular heart rhythm:        Vascular    Pain in calf, thigh, or hip brought on by ambulation:    Pain in feet at night that wakes you up from your sleep:     Blood clot in your veins:    Leg swelling:         Pulmonary    Oxygen at home:    Productive cough:     Wheezing:         Neurologic    Sudden weakness in arms or legs:     Sudden numbness in arms or legs:     Sudden onset of difficulty speaking or slurred speech:    Temporary loss of vision in one eye:     Problems with dizziness:         Gastrointestinal    Blood in stool:     Vomited blood:         Genitourinary    Burning when urinating:     Blood in urine:        Psychiatric    Major depression:         Hematologic    Bleeding problems:    Problems with blood clotting too easily:        Skin    Rashes or ulcers:        Constitutional    Fever or chills:      PHYSICAL EXAM: There were no vitals filed for this visit.   GENERAL: The patient is a well-nourished female, in no acute distress. The vital signs are documented above. CARDIAC: There is a regular  rate and rhythm.  VASCULAR:  Palpable femoral pulses PULMONARY: No respiratory distress ABDOMEN: Soft and non-tender.  No rebound or  guarding. MUSCULOSKELETAL: There are no major deformities or cyanosis. NEUROLOGIC: No focal weakness or paresthesias are detected. SKIN: There are no ulcers or rashes noted. PSYCHIATRIC: The patient has a normal affect.  DATA:   CTA reviewed 09/27/2024 with stable dissection of the splenic artery with no aneurysm and stable dissection/occlusion of the SMA with distal reconstitution and small aneurysm at 7 mm  CTA reviewed 03/16/24 with stable appearance of splenic artery dissection and stable segmental occlusion of the distal SMA with reconstitution  Assessment/Plan:  53 y.o. female, that presents for 6 month follow-up with repeat CT scan for splenic artery dissection.  Previously seen in consultation on 02/27/2024 with acute left-sided abdominal pain with CT evidence of splenic artery dissection with some infarction in the spleen.  She has completed 3 months of anticoagulation for the splenic artery dissection with acute thrombus and concern for splenic infarction.  CTA is stable with stable splenic artery dissection and stable occlusion of the SMA with distal reconstitution.  Will repeat imaging in 1 year to look for ongoing aneurysmal degeneration.  No splenic artery aneurysm at this time.  Small SMA aneurysm 7 mm.     Lonni DOROTHA Gaskins, MD [facility] Office: [phone]

## 2024-10-09 ENCOUNTER — Ambulatory Visit: Attending: Vascular Surgery | Admitting: Vascular Surgery

## 2024-10-09 ENCOUNTER — Encounter: Payer: Self-pay | Admitting: Vascular Surgery

## 2024-10-09 VITALS — BP 107/67 | HR 65 | Temp 98.5°F | Resp 16 | Ht 65.0 in | Wt 115.3 lb

## 2024-10-09 DIAGNOSIS — I7779 Dissection of other artery: Secondary | ICD-10-CM | POA: Diagnosis not present

## 2024-10-09 DIAGNOSIS — K55069 Acute infarction of intestine, part and extent unspecified: Secondary | ICD-10-CM | POA: Diagnosis not present

## 2024-10-10 ENCOUNTER — Other Ambulatory Visit: Payer: Self-pay

## 2024-10-10 DIAGNOSIS — I872 Venous insufficiency (chronic) (peripheral): Secondary | ICD-10-CM

## 2024-12-14 ENCOUNTER — Ambulatory Visit (INDEPENDENT_AMBULATORY_CARE_PROVIDER_SITE_OTHER): Admitting: Physician Assistant

## 2024-12-14 ENCOUNTER — Ambulatory Visit (HOSPITAL_COMMUNITY)
Admission: RE | Admit: 2024-12-14 | Discharge: 2024-12-14 | Disposition: A | Source: Ambulatory Visit | Attending: Vascular Surgery

## 2024-12-14 VITALS — BP 108/69 | HR 66 | Ht 65.0 in | Wt 119.2 lb

## 2024-12-14 DIAGNOSIS — I872 Venous insufficiency (chronic) (peripheral): Secondary | ICD-10-CM | POA: Insufficient documentation

## 2024-12-14 DIAGNOSIS — I8393 Asymptomatic varicose veins of bilateral lower extremities: Secondary | ICD-10-CM

## 2024-12-14 NOTE — Progress Notes (Signed)
 "   Requested by:  Jolaine Pac, DO [address], [address] [place],  [place] [address]  Reason for consultation: varicose veins of LLE    History of Present Illness   Diane Holmes is a 54 y.o. ([birth year]) female who presents for evaluation of varicose veins of her left lower extremity. She is a known patient of our practice. She has recently seen Dr. Gretta for splenic artery dissection with some infarction in the spleen. She completed 3 months of anticoagulation. Her recent follow up CTA showed stable aneurysm and she will follow up for this in 1 year. At time of her visit she requested evaluation of varicosities on her left leg. She is here today for that follow up.  She explains that she has had swelling, varicose veins and cramping for many years. The left leg is worse than the right. She gets cramping in her legs very regularly both during day and at night. Her legs swell daily especially around the ankle. She does not really describe any pain in her legs. 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 She has been doing this type of work for 15+ years. She does elevate her legs regularly but has never worn compression stockings. No history of DVT.  Venous symptoms include: swelling, cramping, varicose veins Onset/duration:  > many years  Occupation:  Frames doors for [employer] Aggravating factors: sitting, standing Alleviating factors: elevation Compression:  no   Helps:  no Pain medications:  no Previous vein procedures:  no History of DVT:  No  Past Medical History:  Diagnosis Date   Splenic infarction 02/27/2024   Found 02/28/2024.     Ulcer     Past Surgical History:  Procedure Laterality Date   TUBAL LIGATION      Social History   Socioeconomic History   Marital status: Single    Spouse name: Not on file   Number of children: Not on file   Years of education: Not on file   Highest education level: 12th grade  Occupational  History   Not on file  Tobacco Use   Smoking status: Every Day    Current packs/day: 1.00    Average packs/day: 1 pack/day for 39.8 years (39.8 ttl pk-yrs)    Types: Cigarettes    Start date: 03/05/1985   Smokeless tobacco: Never  Vaping Use   Vaping status: Never Used  Substance and Sexual Activity   Alcohol use: Yes    Comment: occasionally   Drug use: No   Sexual activity: Yes    Birth control/protection: Surgical  Other Topics Concern   Not on file  Social History Narrative   Not on file   Social Drivers of Health   Tobacco Use: High Risk (12/14/2024)   Patient History    Smoking Tobacco Use: Every Day    Smokeless Tobacco Use: Never    Passive Exposure: Not on file  Financial Resource Strain: High Risk (03/05/2024)   Overall Financial Resource Strain (CARDIA)    Difficulty of Paying Living Expenses: Hard  Food Insecurity: Food Insecurity Present (03/05/2024)   Hunger Vital Sign    Worried About Running Out of Food in the Last Year: Often true    Ran Out of Food in the Last Year: Sometimes true  Transportation Needs: No Transportation Needs (03/05/2024)   PRAPARE - Administrator, Civil Service (Medical): No    Lack of Transportation (Non-Medical): No  Physical Activity:  Sufficiently Active (03/05/2024)   Exercise Vital Sign    Days of Exercise per Week: 5 days    Minutes of Exercise per Session: 140 min  Stress: Stress Concern Present (03/05/2024)   [employer] of Occupational Health - Occupational Stress Questionnaire    Feeling of Stress : To some extent  Social Connections: Moderately Isolated (03/05/2024)   Social Connection and Isolation Panel    Frequency of Communication with Friends and Family: More than three times a week    Frequency of Social Gatherings with Friends and Family: Twice a week    Attends Religious Services: Never    Database Administrator or Organizations: No    Attends Engineer, Structural: Not on file    Marital  Status: Living with partner  Intimate Partner Violence: Not At Risk (02/27/2024)   Humiliation, Afraid, Rape, and Kick questionnaire    Fear of Current or Ex-Partner: No    Emotionally Abused: No    Physically Abused: No    Sexually Abused: No  Depression (PHQ2-9): Not on file  Alcohol Screen: Not on file  Housing: High Risk (03/05/2024)   Housing Stability Vital Sign    Unable to Pay for Housing in the Last Year: Yes    Number of Times Moved in the Last Year: 0    Homeless in the Last Year: No  Utilities: Not At Risk (02/27/2024)   AHC Utilities    Threatened with loss of utilities: No  Health Literacy: Not on file    Family History  Problem Relation Age of Onset   Stroke Mother    COPD Mother    Stroke Father    Parkinson's disease Father    Breast cancer Maternal Grandmother    Stroke Maternal Grandmother    Diabetes Paternal Uncle     Current Outpatient Medications  Medication Sig Dispense Refill   acetaminophen  (TYLENOL ) 500 MG tablet Take 2 tablets (1,000 mg total) by mouth every 8 (eight) hours. 30 tablet 0   apixaban  (ELIQUIS ) 5 MG TABS tablet Take 1 tablet (5 mg total) by mouth 2 (two) times daily. (Patient not taking: Reported on 12/14/2024) 60 tablet 0   pantoprazole  (PROTONIX ) 40 MG tablet Take 1 tablet (40 mg total) by mouth 2 (two) times daily for 14 days. (Patient not taking: Reported on 12/14/2024) 28 tablet 0   simvastatin  (ZOCOR ) 20 MG tablet Take 1 tablet (20 mg total) by mouth every evening. (Patient not taking: Reported on 12/14/2024) 30 tablet 6   No current facility-administered medications for this visit.    Allergies[1]  REVIEW OF SYSTEMS (negative unless checked):   Cardiac:  []  Chest pain or chest pressure? []  Shortness of breath upon activity? []  Shortness of breath when lying flat? []  Irregular heart rhythm?  Vascular:  []  Pain in calf, thigh, or hip brought on by walking? []  Pain in feet at night that wakes you up from your sleep? []  Blood  clot in your veins? [x]  Leg swelling?  Pulmonary:  []  Oxygen at home? []  Productive cough? []  Wheezing?  Neurologic:  []  Sudden weakness in arms or legs? []  Sudden numbness in arms or legs? []  Sudden onset of difficult speaking or slurred speech? []  Temporary loss of vision in one eye? []  Problems with dizziness?  Gastrointestinal:  []  Blood in stool? []  Vomited blood?  Genitourinary:  []  Burning when urinating? []  Blood in urine?  Psychiatric:  []  Major depression  Hematologic:  []  Bleeding problems? []  Problems with  blood clotting?  Dermatologic:  []  Rashes or ulcers?  Constitutional:  []  Fever or chills?  Ear/Nose/Throat:  []  Change in hearing? []  Nose bleeds? []  Sore throat?  Musculoskeletal:  []  Back pain? []  Joint pain? []  Muscle pain?   Physical Examination     Vitals:   12/14/24 1330  BP: 108/69  Pulse: 66  Weight: 119 lb 3.2 oz (54.1 kg)  Height: 5' 5 (1.651 m)   Body mass index is 19.84 kg/m.  General:  WDWN in NAD; vital signs documented above Gait: Normal HENT: WNL, normocephalic Pulmonary: normal non-labored breathing  Cardiac: regular HR Abdomen: soft Extremities: with varicose veins, with reticular veins, with edema, without stasis pigmentation, without lipodermatosclerosis, without ulcers Musculoskeletal: no muscle wasting or atrophy  Neurologic: A&O X 3;  No focal weakness or paresthesias are detected Psychiatric:  The pt has Normal affect.  Non-invasive Vascular Imaging   BLE Venous Insufficiency Duplex (12/14/24): LLE: No DVT and SVT,  GSV reflux SFJ to mid thigh, prox to distal calf GSV diameter 0.31-1.13 in proximal thigh SSV reflux at SPJ CFV, FV deep venous reflux   Medical Decision Making   KESHANNA RISO is a 54 y.o. female who presents with: LLE chronic venous insufficiency with varicose veins with cramping and swelling. This has been ongoing for many years. She is on her feet for prolonged hours for work.  Her duplex today shows no DVT or SVT. She does have incompetent veins both in the deep and superficial system of the LLE. Her GSV is large from the mid thigh to SFJ. For this reason I think she could be a candidate for a venous ablation.  Based on the patient's history and examination, I recommend: daily elevation of 20-30 minutes above level of heart, daily compression stocking use, exercise, weight reduction, refraining from prolonged sitting or standing. I discussed with the patient the use of her 20-30 mm thigh high compression stockings and need for 3 month trial of such. She was measured and fitted for a pair today Patient was provided with information about vein health The patient will follow up in 3 months with Dr. Serene or Dr. Sheree Teretha Damme, PA-C [facility] Office: [phone]  12/14/2024, 2:00 PM  Clinic MD: Pearline     [1]  Allergies Allergen Reactions   Penicillins Hives    Reported as childhood allergy, told by parent that she got hives and had trouble breathing   "

## 2025-02-25 ENCOUNTER — Ambulatory Visit: Admitting: Surgery

## 2025-03-04 ENCOUNTER — Ambulatory Visit: Admitting: Surgery

## 2025-03-15 ENCOUNTER — Ambulatory Visit: Admitting: Vascular Surgery
